# Patient Record
Sex: Female | Born: 1990 | Race: White | Hispanic: No | Marital: Single | Smoking: Never smoker
Health system: Southern US, Community
[De-identification: ages and names within clinical notes are randomized; demographics above are authoritative.]

## PROBLEM LIST (undated history)

## (undated) DIAGNOSIS — T7840XA Allergy, unspecified, initial encounter: Secondary | ICD-10-CM

## (undated) DIAGNOSIS — S43006A Unspecified dislocation of unspecified shoulder joint, initial encounter: Secondary | ICD-10-CM

## (undated) DIAGNOSIS — F909 Attention-deficit hyperactivity disorder, unspecified type: Secondary | ICD-10-CM

## (undated) DIAGNOSIS — F419 Anxiety disorder, unspecified: Secondary | ICD-10-CM

## (undated) HISTORY — DX: Allergy, unspecified, initial encounter: T78.40XA

## (undated) HISTORY — PX: EXTERNAL EAR SURGERY: SHX627

## (undated) HISTORY — DX: Anxiety disorder, unspecified: F41.9

---

## 2010-08-10 HISTORY — PX: SHOULDER SURGERY: SHX246

## 2010-12-17 ENCOUNTER — Emergency Department (HOSPITAL_BASED_OUTPATIENT_CLINIC_OR_DEPARTMENT_OTHER): Payer: Managed Care, Other (non HMO)

## 2010-12-17 ENCOUNTER — Emergency Department (HOSPITAL_BASED_OUTPATIENT_CLINIC_OR_DEPARTMENT_OTHER)
Admission: EM | Admit: 2010-12-17 | Discharge: 2010-12-18 | Disposition: A | Discharge status: Home / Self Care | Payer: Managed Care, Other (non HMO) | Attending: Emergency Medicine | Admitting: Emergency Medicine

## 2010-12-17 DIAGNOSIS — S43006A Unspecified dislocation of unspecified shoulder joint, initial encounter: Secondary | ICD-10-CM | POA: Insufficient documentation

## 2010-12-17 DIAGNOSIS — X500XXA Overexertion from strenuous movement or load, initial encounter: Secondary | ICD-10-CM | POA: Insufficient documentation

## 2010-12-17 DIAGNOSIS — Y92009 Unspecified place in unspecified non-institutional (private) residence as the place of occurrence of the external cause: Secondary | ICD-10-CM | POA: Insufficient documentation

## 2011-05-04 ENCOUNTER — Ambulatory Visit (INDEPENDENT_AMBULATORY_CARE_PROVIDER_SITE_OTHER): Payer: Managed Care, Other (non HMO) | Admitting: Family Medicine

## 2011-05-04 ENCOUNTER — Encounter: Payer: Self-pay | Admitting: Family Medicine

## 2011-05-04 ENCOUNTER — Ambulatory Visit (HOSPITAL_BASED_OUTPATIENT_CLINIC_OR_DEPARTMENT_OTHER)
Admission: RE | Admit: 2011-05-04 | Discharge: 2011-05-04 | Disposition: A | Discharge status: Home / Self Care | Payer: Managed Care, Other (non HMO) | Source: Ambulatory Visit | Attending: Family Medicine | Admitting: Family Medicine

## 2011-05-04 VITALS — BP 136/86 | HR 76 | Temp 98.1°F | Ht 62.0 in | Wt 119.0 lb

## 2011-05-04 DIAGNOSIS — S43006A Unspecified dislocation of unspecified shoulder joint, initial encounter: Secondary | ICD-10-CM

## 2011-05-04 DIAGNOSIS — M24419 Recurrent dislocation, unspecified shoulder: Secondary | ICD-10-CM

## 2011-05-04 DIAGNOSIS — S43005A Unspecified dislocation of left shoulder joint, initial encounter: Secondary | ICD-10-CM

## 2011-05-04 NOTE — Patient Instructions (Signed)
You have recurrent shoulder subluxations and instability. You have also failed conservative treatment for this (physical therapy for at least 6 weeks, relative rest). The next step in evaluation is to go ahead with an MRI arthrogram that looks for a labral tear in this shoulder. This will also dictate what type of surgery you will likely need. I will call you with the results of your MRI and how we are to proceed.

## 2011-05-05 ENCOUNTER — Other Ambulatory Visit: Payer: Self-pay | Admitting: Family Medicine

## 2011-05-05 ENCOUNTER — Encounter: Payer: Self-pay | Admitting: Family Medicine

## 2011-05-05 DIAGNOSIS — S43005A Unspecified dislocation of left shoulder joint, initial encounter: Secondary | ICD-10-CM | POA: Insufficient documentation

## 2011-05-05 NOTE — Assessment & Plan Note (Signed)
2/2 recurrent subluxations and ? dislocations in the past 4 years.  She has already gone through at least 6 weeks of aggressive physical therapy, does home exercises and having persistent issues with subluxations.  Will move forward with MR arthrogram to further assess.  X-rays showed no evidence of bony bankart, otherwise normal.  Will call patient with results.

## 2011-05-05 NOTE — Progress Notes (Signed)
Addended by: Lenda Kelp on: 05/05/2011 11:38 AM   Modules accepted: Orders
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
## 2011-05-05 NOTE — Progress Notes (Signed)
Addended by: Lenda Kelp on: 05/05/2011 11:34 AM   Modules accepted: Orders

## 2011-05-12 ENCOUNTER — Ambulatory Visit
Admission: RE | Admit: 2011-05-12 | Discharge: 2011-05-12 | Disposition: A | Discharge status: Home / Self Care | Payer: Managed Care, Other (non HMO) | Source: Ambulatory Visit | Attending: Family Medicine | Admitting: Family Medicine

## 2011-05-12 DIAGNOSIS — S43005A Unspecified dislocation of left shoulder joint, initial encounter: Secondary | ICD-10-CM

## 2011-05-12 MED ORDER — IOHEXOL 180 MG/ML  SOLN
12.0000 mL | Freq: Once | INTRAMUSCULAR | Status: AC | PRN
  Administered 2011-05-12: 12 mL via INTRA_ARTICULAR

## 2011-05-14 NOTE — Progress Notes (Signed)
Addended by: Lenda Kelp on: 05/14/2011 03:36 PM   Modules accepted: Orders

## 2011-06-23 ENCOUNTER — Other Ambulatory Visit: Payer: Self-pay | Admitting: Orthopedic Surgery

## 2011-07-11 DIAGNOSIS — S43006A Unspecified dislocation of unspecified shoulder joint, initial encounter: Secondary | ICD-10-CM

## 2011-07-11 HISTORY — DX: Unspecified dislocation of unspecified shoulder joint, initial encounter: S43.006A

## 2011-07-30 ENCOUNTER — Encounter (HOSPITAL_BASED_OUTPATIENT_CLINIC_OR_DEPARTMENT_OTHER): Payer: Self-pay | Admitting: *Deleted

## 2011-08-07 ENCOUNTER — Encounter (HOSPITAL_BASED_OUTPATIENT_CLINIC_OR_DEPARTMENT_OTHER): Payer: Self-pay | Admitting: Anesthesiology

## 2011-08-07 ENCOUNTER — Encounter (HOSPITAL_BASED_OUTPATIENT_CLINIC_OR_DEPARTMENT_OTHER)
Admission: RE | Disposition: A | Discharge status: Home / Self Care | Payer: Self-pay | Source: Ambulatory Visit | Attending: Orthopedic Surgery

## 2011-08-07 ENCOUNTER — Ambulatory Visit (HOSPITAL_BASED_OUTPATIENT_CLINIC_OR_DEPARTMENT_OTHER): Payer: Managed Care, Other (non HMO) | Admitting: Anesthesiology

## 2011-08-07 ENCOUNTER — Ambulatory Visit (HOSPITAL_BASED_OUTPATIENT_CLINIC_OR_DEPARTMENT_OTHER)
Admission: RE | Admit: 2011-08-07 | Discharge: 2011-08-07 | Disposition: A | Discharge status: Home / Self Care | Payer: Managed Care, Other (non HMO) | Source: Ambulatory Visit | Attending: Orthopedic Surgery | Admitting: Orthopedic Surgery

## 2011-08-07 ENCOUNTER — Encounter (HOSPITAL_BASED_OUTPATIENT_CLINIC_OR_DEPARTMENT_OTHER): Payer: Self-pay | Admitting: Certified Registered"

## 2011-08-07 ENCOUNTER — Encounter (HOSPITAL_BASED_OUTPATIENT_CLINIC_OR_DEPARTMENT_OTHER): Payer: Self-pay | Admitting: *Deleted

## 2011-08-07 DIAGNOSIS — Z01812 Encounter for preprocedural laboratory examination: Secondary | ICD-10-CM | POA: Insufficient documentation

## 2011-08-07 DIAGNOSIS — M24419 Recurrent dislocation, unspecified shoulder: Secondary | ICD-10-CM | POA: Insufficient documentation

## 2011-08-07 DIAGNOSIS — S43005A Unspecified dislocation of left shoulder joint, initial encounter: Secondary | ICD-10-CM

## 2011-08-07 HISTORY — DX: Unspecified dislocation of unspecified shoulder joint, initial encounter: S43.006A

## 2011-08-07 HISTORY — DX: Attention-deficit hyperactivity disorder, unspecified type: F90.9

## 2011-08-07 HISTORY — PX: SHOULDER ARTHROSCOPY: SHX128

## 2011-08-07 SURGERY — ARTHROSCOPY, SHOULDER
Anesthesia: General | Site: Shoulder | Laterality: Left | Wound class: Clean

## 2011-08-07 MED ORDER — METOCLOPRAMIDE HCL 5 MG/ML IJ SOLN: 10.0000 mg | Freq: Once | INTRAMUSCULAR | Status: DC | PRN

## 2011-08-07 MED ORDER — ONDANSETRON HCL 4 MG/2ML IJ SOLN
4.0000 mg | Freq: Once | INTRAMUSCULAR | Status: AC
  Administered 2011-08-07: 4 mg via INTRAVENOUS

## 2011-08-07 MED ORDER — FENTANYL CITRATE 0.05 MG/ML IJ SOLN: 25.0000 ug | INTRAMUSCULAR | Status: DC | PRN

## 2011-08-07 MED ORDER — OXYCODONE-ACETAMINOPHEN 10-325 MG PO TABS: 1.0000 | ORAL_TABLET | Freq: Four times a day (QID) | ORAL | Status: AC | PRN

## 2011-08-07 MED ORDER — LIDOCAINE HCL 1 % IJ SOLN
INTRAMUSCULAR | Status: DC | PRN
  Administered 2011-08-07: 2 mL via INTRADERMAL

## 2011-08-07 MED ORDER — SODIUM CHLORIDE 0.9 % IR SOLN
Status: DC | PRN
  Administered 2011-08-07: 3000 mL

## 2011-08-07 MED ORDER — DEXAMETHASONE SODIUM PHOSPHATE 4 MG/ML IJ SOLN
INTRAMUSCULAR | Status: DC | PRN
  Administered 2011-08-07: 10 mg via INTRAVENOUS

## 2011-08-07 MED ORDER — SUCCINYLCHOLINE CHLORIDE 20 MG/ML IJ SOLN
INTRAMUSCULAR | Status: DC | PRN
  Administered 2011-08-07: 120 mg via INTRAVENOUS

## 2011-08-07 MED ORDER — ONDANSETRON HCL 4 MG/2ML IJ SOLN
INTRAMUSCULAR | Status: DC | PRN
  Administered 2011-08-07: 4 mg via INTRAVENOUS

## 2011-08-07 MED ORDER — ROPIVACAINE HCL 5 MG/ML IJ SOLN
INTRAMUSCULAR | Status: DC | PRN
  Administered 2011-08-07: 20 mL via EPIDURAL

## 2011-08-07 MED ORDER — PROPOFOL 10 MG/ML IV EMUL
INTRAVENOUS | Status: DC | PRN
  Administered 2011-08-07: 200 mg via INTRAVENOUS

## 2011-08-07 MED ORDER — FENTANYL CITRATE 0.05 MG/ML IJ SOLN
50.0000 ug | INTRAMUSCULAR | Status: DC | PRN
  Administered 2011-08-07: 100 ug via INTRAVENOUS

## 2011-08-07 MED ORDER — MORPHINE SULFATE 2 MG/ML IJ SOLN: 0.0500 mg/kg | INTRAMUSCULAR | Status: DC | PRN

## 2011-08-07 MED ORDER — CEFAZOLIN SODIUM 1-5 GM-% IV SOLN
1.0000 g | INTRAVENOUS | Status: AC
  Administered 2011-08-07: 1 g via INTRAVENOUS

## 2011-08-07 MED ORDER — METHOCARBAMOL 500 MG PO TABS: 500.0000 mg | ORAL_TABLET | Freq: Four times a day (QID) | ORAL | Status: AC

## 2011-08-07 MED ORDER — MIDAZOLAM HCL 2 MG/2ML IJ SOLN
0.5000 mg | INTRAMUSCULAR | Status: DC | PRN
  Administered 2011-08-07: 2 mg via INTRAVENOUS

## 2011-08-07 MED ORDER — CHLORHEXIDINE GLUCONATE 4 % EX LIQD: 60.0000 mL | Freq: Once | CUTANEOUS | Status: DC

## 2011-08-07 MED ORDER — PROMETHAZINE HCL 25 MG PO TABS: 25.0000 mg | ORAL_TABLET | Freq: Four times a day (QID) | ORAL | Status: AC | PRN

## 2011-08-07 MED ORDER — LACTATED RINGERS IV SOLN
INTRAVENOUS | Status: DC
  Administered 2011-08-07 (×2): via INTRAVENOUS

## 2011-08-07 MED ORDER — GLYCOPYRROLATE 0.2 MG/ML IJ SOLN
0.2000 mg | Freq: Once | INTRAMUSCULAR | Status: AC | PRN
  Administered 2011-08-07: 0.1 mg via INTRAVENOUS

## 2011-08-07 SURGICAL SUPPLY — 67 items
2.9MM HIP PUSHLOCK KIT ×2 IMPLANT
ANCHOR PUSHLOCK BIOCOMP 2.9X15 (Orthopedic Implant) ×6 IMPLANT
BENZOIN TINCTURE PRP APPL 2/3 (GAUZE/BANDAGES/DRESSINGS) ×2 IMPLANT
BLADE 4.2CUDA (BLADE) IMPLANT
BLADE CUDA GRT WHITE 3.5 (BLADE) IMPLANT
BLADE CUTTER GATOR 3.5 (BLADE) ×2 IMPLANT
BLADE GREAT WHITE 4.2 (BLADE) IMPLANT
BLADE SURG 15 STRL LF DISP TIS (BLADE) IMPLANT
BLADE SURG 15 STRL SS (BLADE)
BUR OVAL 6.0 (BURR) IMPLANT
CANISTER OMNI JUG 16 LITER (MISCELLANEOUS) ×2 IMPLANT
CANNULA 5.75X71 LONG (CANNULA) ×2 IMPLANT
CANNULA ACUFLEX KIT 5X76 (CANNULA) IMPLANT
CANNULA TWIST IN 8.25X7CM (CANNULA) ×2 IMPLANT
CANNULA TWIST IN 8.25X9CM (CANNULA) IMPLANT
CLOTH BEACON ORANGE TIMEOUT ST (SAFETY) ×2 IMPLANT
CUTTER MENISCUS  4.2MM (BLADE)
CUTTER MENISCUS 4.2MM (BLADE) IMPLANT
DECANTER SPIKE VIAL GLASS SM (MISCELLANEOUS) IMPLANT
DRAPE INCISE IOBAN 66X45 STRL (DRAPES) ×2 IMPLANT
DRAPE SHOULDER BEACH CHAIR (DRAPES) ×2 IMPLANT
DRAPE U 20/CS (DRAPES) ×2 IMPLANT
DRAPE U-SHAPE 47X51 STRL (DRAPES) ×2 IMPLANT
DRSG PAD ABDOMINAL 8X10 ST (GAUZE/BANDAGES/DRESSINGS) ×2 IMPLANT
DURAPREP 26ML APPLICATOR (WOUND CARE) ×2 IMPLANT
ELECT REM PT RETURN 9FT ADLT (ELECTROSURGICAL) ×2
ELECTRODE REM PT RTRN 9FT ADLT (ELECTROSURGICAL) ×1 IMPLANT
FIBERSTICK 2 (SUTURE) ×6 IMPLANT
GLOVE BIO SURGEON STRL SZ 6.5 (GLOVE) ×2 IMPLANT
GLOVE BIO SURGEON STRL SZ8 (GLOVE) ×2 IMPLANT
GLOVE BIOGEL PI IND STRL 7.0 (GLOVE) ×1 IMPLANT
GLOVE BIOGEL PI INDICATOR 7.0 (GLOVE) ×1
GOWN PREVENTION PLUS XLARGE (GOWN DISPOSABLE) ×4 IMPLANT
GOWN PREVENTION PLUS XXLARGE (GOWN DISPOSABLE) ×2 IMPLANT
IMMOBILIZER SHOULDER XLGE (ORTHOPEDIC SUPPLIES) IMPLANT
KIT SHOULDER TRACTION (DRAPES) ×2 IMPLANT
LASSO SUT 90 DEGREE (SUTURE) IMPLANT
LOOP 2 FIBERLINK CLOSED (SUTURE) IMPLANT
PACK ARTHROSCOPY DSU (CUSTOM PROCEDURE TRAY) ×2 IMPLANT
PACK BASIN DAY SURGERY FS (CUSTOM PROCEDURE TRAY) ×2 IMPLANT
SET ARTHROSCOPY TUBING (MISCELLANEOUS) ×1
SET ARTHROSCOPY TUBING LN (MISCELLANEOUS) ×1 IMPLANT
SHEET MEDIUM DRAPE 40X70 STRL (DRAPES) ×2 IMPLANT
SLEEVE SCD COMPRESS KNEE MED (MISCELLANEOUS) ×2 IMPLANT
SLING ARM FOAM STRAP LRG (SOFTGOODS) IMPLANT
SLING ARM FOAM STRAP MED (SOFTGOODS) IMPLANT
SLING ARM FOAM STRAP XLG (SOFTGOODS) IMPLANT
SLING ARM IMMOBILIZER LRG (SOFTGOODS) ×2 IMPLANT
SLING ARM IMMOBILIZER MED (SOFTGOODS) IMPLANT
SPONGE GAUZE 4X4 12PLY (GAUZE/BANDAGES/DRESSINGS) ×2 IMPLANT
STRIP CLOSURE SKIN 1/2X4 (GAUZE/BANDAGES/DRESSINGS) ×2 IMPLANT
SUT FIBERWIRE #2 38 T-5 BLUE (SUTURE)
SUT LASSO 45 DEGREE (SUTURE) IMPLANT
SUT LASSO 45 DEGREE LEFT (SUTURE) ×2 IMPLANT
SUT LASSO 45D RIGHT (SUTURE) IMPLANT
SUT MNCRL AB 4-0 PS2 18 (SUTURE) IMPLANT
SUT PDS AB 1 CT  36 (SUTURE)
SUT PDS AB 1 CT 36 (SUTURE) IMPLANT
SUT TIGER TAPE 7 IN WHITE (SUTURE) IMPLANT
SUT VIC AB 3-0 SH 27 (SUTURE)
SUT VIC AB 3-0 SH 27X BRD (SUTURE) IMPLANT
SUTURE FIBERWR #2 38 T-5 BLUE (SUTURE) IMPLANT
TAPE CLOTH SURG 6X10 WHT LF (GAUZE/BANDAGES/DRESSINGS) ×2 IMPLANT
TOWEL OR 17X24 6PK STRL BLUE (TOWEL DISPOSABLE) ×2 IMPLANT
TUBE CONNECTING 20X1/4 (TUBING) ×2 IMPLANT
WAND STAR VAC 90 (SURGICAL WAND) ×2 IMPLANT
WATER STERILE IRR 1000ML POUR (IV SOLUTION) ×2 IMPLANT

## 2011-08-07 NOTE — Anesthesia Procedure Notes (Addendum)
Anesthesia Regional Block:  Interscalene brachial plexus block  Pre-Anesthetic Checklist: ,, timeout performed, Correct Patient, Correct Site, Correct Laterality, Correct Procedure, Correct Position, site marked, Risks and benefits discussed,  Surgical consent,  Pre-op evaluation,  At surgeon's request and post-op pain management  Laterality: Left  Prep: chloraprep       Needles:   Needle Type: Other   (Arrow Echogenic)   Needle Length: 9cm  Needle Gauge: 21    Additional Needles:  Procedures: ultrasound guided Interscalene brachial plexus block Narrative:  Start time: 08/07/2011 7:00 AM End time: 08/07/2011 7:08 AM Injection made incrementally with aspirations every 5 mL.  Performed by: Personally  Anesthesiologist: Aldona Lento, MD  Additional Notes: Ultrasound guidance used to: id relevant anatomy, confirm needle position, local anesthetic spread, avoidance of vascular puncture. Picture saved. No complications. Block performed personally by Janetta Hora. Gelene Mink, MD    Interscalene brachial plexus block Procedure Name: Intubation Date/Time: 08/07/2011 7:44 AM Performed by: Radford Pax Pre-anesthesia Checklist: Patient identified, Emergency Drugs available, Suction available, Patient being monitored and Timeout performed Patient Re-evaluated:Patient Re-evaluated prior to inductionOxygen Delivery Method: Circle System Utilized Preoxygenation: Pre-oxygenation with 100% oxygen Intubation Type: IV induction Ventilation: Mask ventilation without difficulty Laryngoscope Size: 3 and Miller Grade View: Grade I Tube type: Oral (cords open and clear) Number of attempts: 1 (easy intubation) Airway Equipment and Method: stylet and oral airway Placement Confirmation: ETT inserted through vocal cords under direct vision,  positive ETCO2 and breath sounds checked- equal and bilateral Secured at: 21 cm Tube secured with: Tape (pink tape used) Dental Injury: Teeth and  Oropharynx as per pre-operative assessment

## 2011-08-07 NOTE — Anesthesia Postprocedure Evaluation (Signed)
Anesthesia Post Note  Patient: Karen Hensley  Procedure(s) Performed:  ARTHROSCOPY SHOULDER - arthroscopy left shoulder with extensive debridement, capsulorrhaphy, Bankhardt repair  Anesthesia type: General  Patient location: PACU  Post pain: Pain level controlled  Post assessment: Patient's Cardiovascular Status Stable  Last Vitals:  Filed Vitals:   08/07/11 1034  BP: 111/75  Pulse: 84  Temp: 36.4 C  Resp: 22    Post vital signs: Reviewed and stable  Level of consciousness: alert  Complications: No apparent anesthesia complications

## 2011-08-07 NOTE — Anesthesia Preprocedure Evaluation (Signed)
Anesthesia Evaluation  Patient identified by MRN, date of birth, ID band Patient awake    Reviewed: Allergy & Precautions, H&P , NPO status , Patient's Chart, lab work & pertinent test results, reviewed documented beta blocker date and time   Airway Mallampati: II TM Distance: >3 FB Neck ROM: full    Dental   Pulmonary neg pulmonary ROS,          Cardiovascular neg cardio ROS     Neuro/Psych PSYCHIATRIC DISORDERS Negative Neurological ROS     GI/Hepatic negative GI ROS, Neg liver ROS,   Endo/Other  Negative Endocrine ROS  Renal/GU negative Renal ROS  Genitourinary negative   Musculoskeletal   Abdominal   Peds  Hematology negative hematology ROS (+)   Anesthesia Other Findings See surgeon's H&P   Reproductive/Obstetrics negative OB ROS                           Anesthesia Physical Anesthesia Plan  ASA: II  Anesthesia Plan: General   Post-op Pain Management: MAC Combined w/ Regional for Post-op pain   Induction: Intravenous  Airway Management Planned: Oral ETT  Additional Equipment:   Intra-op Plan:   Post-operative Plan: Extubation in OR  Informed Consent: I have reviewed the patients History and Physical, chart, labs and discussed the procedure including the risks, benefits and alternatives for the proposed anesthesia with the patient or authorized representative who has indicated his/her understanding and acceptance.     Plan Discussed with: CRNA and Surgeon  Anesthesia Plan Comments:         Anesthesia Quick Evaluation

## 2011-08-07 NOTE — Op Note (Signed)
08/07/2011  9:31 AM  PATIENT:  Karen Hensley    PRE-OPERATIVE DIAGNOSIS:  left shoulder dislocation/subluxation, recurrent instability  POST-OPERATIVE DIAGNOSIS:  Same  PROCEDURE:  ARTHROSCOPY SHOULDER Bankart anterior reconstruction  SURGEON:  Ira Busbin P  PHYSICIAN ASSISTANT: Janace Litten, OPA-C, present and scrubbed throughout the case, critical for completion in a timely fashion, and for retraction, instrumentation, and closure.  ANESTHESIA:   General  PREOPERATIVE INDICATIONS:  Karen Hensley is a  20 y.o. female with a diagnosis of left shoulder dislocation/subluxation, recurrent instability who failed conservative measures and elected for surgical management.  She has had "over 1000" instability events per her report.  The risks benefits and alternatives were discussed with the patient preoperatively including but not limited to the risks of infection, bleeding, nerve injury, cardiopulmonary complications, the need for revision surgery, among others, and the patient was willing to proceed. We also discussed the risks of instability, recurrent, the need for Latarjet reconstruction, posttraumatic arthrosis, stiffness, loss of function, among others and she was willing to proceed.  OPERATIVE IMPLANTS: Arthrex 2.9 mm bio composite anchors, push lock x3  OPERATIVE FINDINGS: Massive anterior glenoid bone loss, at least 40%. There was also a bony Bankart. There was a moderate sized Hill-Sachs lesion. The superior labrum was well anchored to the glenoid and the biceps tendon was intact. The posterior labrum was also intact. Examination under anesthesia demonstrated easy anterior dislocation, while the posterior was stable.  OPERATIVE PROCEDURE: The patient was brought to the operating room and placed in supine position. General anesthesia was administered. IV antibiotics were given. She was turned in a semilateral decubitus position all bony prominences were padded. The left upper  extremity was prepped and draped in usual sterile fashion. Exam under anesthesia was performed prior to prepping. Time out was performed. After diagnostic arthroscopy, I placed 2 anterior portals and viewing from the anterior superior portal. I elevated the anterior labrum using a spatula elevator, and also used the arthroscopic shaver to prepare the bone anterior on the glenoid neck. I elevated the bony fragments from the bony Bankart all the way down, and once I had excellent mobility, I then placed a total of 3 simple sutures. These were in a circumferential type configuration, ensnaring the bony Bankart piece. The first stitch I placed in 2 passes in order to get deep enough to fully encapsulate the bony fragment.  I passed 2 sutures at first, both around the bony Bankart piece, and then anchored the inferior one, advancing it up onto the face. I was nearly on the bare spot, which was the anterior glenoid rim, in order to achieve bony purchase with the anchor.  I then secured the second suture, and this provided excellent advancement of the soft tissue and restoration of tension.  I then passed a third suture just at the level of the middle glenohumeral ligament, just anterior to the subscapularis, and then anchored to this using a third push lock at the top quadrant of the glenoid.  Final pictures were taken, all loose debris was removed, the sutures were cut and removed, and excellent soft tissue reconstruction was achieved. The shoulder was irrigated copiously and then the instruments removed and the portals repaired with Monocryl followed by Steri-Strips and sterile gauze. There no complications and she tolerated the procedure well.

## 2011-08-07 NOTE — Progress Notes (Signed)
Assisted Dr. Gelene Mink with left interscaline block. Side rails up, monitors on throughout procedure. See vital signs in flow sheet. Tolerated Procedure well.

## 2011-08-07 NOTE — H&P (Signed)
  PREOPERATIVE H&P  Chief Complaint: left shoulder dislocation/subluxation, recurrent instability  HPI: Karen Hensley is a 20 y.o. female who presents for preoperative history and physical with a diagnosis of left shoulder dislocation/subluxation, recurrent instability. Symptoms are rated as moderate to severe, and have been worsening.  This is significantly impairing activities of daily living.  She has elected for surgical management.   Past Medical History  Diagnosis Date  . Anxiety     anxiety attacks  . 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 ADHD (attention deficit hyperactivity disorder)    Past Surgical History  Procedure Date  . External ear surgery 4-5 yrs. ago    cauliflower ear   History   Social History  . Marital Status: Single    Spouse Name: N/A    Number of Children: N/A  . Years of Education: N/A   Social History Main Topics  . Smoking status: Never Smoker   . Smokeless tobacco: Never Used  . Alcohol Use: Yes     occasionally  . Drug Use: No  . Sexually Active: None   Other Topics Concern  . None   Social History Narrative  . None   Family History  Problem Relation Age of Onset  . Heart attack Paternal Grandfather   . Diabetes Neg Hx   . Hyperlipidemia Neg Hx   . Hypertension Neg Hx   . Sudden death Neg Hx    Allergies  Allergen Reactions  . Sulfa Antibiotics Swelling    Swelling of face   Prior to Admission medications   Medication Sig Start Date End Date Taking? Authorizing Provider  amphetamine-dextroamphetamine (ADDERALL XR) 10 MG 24 hr capsule Take 10 mg by mouth every morning.      Historical Provider, MD     Positive ROS: All other systems have been reviewed and were otherwise negative with the exception of those mentioned in the HPI and as above.  Physical Exam: General: Alert, no acute distress Cardiovascular: No pedal edema Respiratory: No cyanosis, no use of accessory musculature GI:  No organomegaly, abdomen is soft and non-tender Skin: No lesions in the area of chief complaint Neurologic: Sensation intact distally Psychiatric: Patient is competent for consent with normal mood and affect Lymphatic: No axillary or cervical lymphadenopathy  MUSCULOSKELETAL: Left shoulder has positive apprehension. Sensation intact at the hand.  Assessment: left shoulder dislocation/subluxation, recurrent instability  Plan: Plan for Procedure(s): ARTHROSCOPY SHOULDER, anterior capsulorrhaphy and labral repair with bony Bankart repair  The risks benefits and alternatives were discussed with the patient including but not limited to the risks of nonoperative treatment, versus surgical intervention including infection, bleeding, nerve injury,  blood clots, cardiopulmonary complications, morbidity, mortality, among others, and they were willing to proceed. We'll discuss the risks of recurrent instability, posttraumatic chondrolysis and arthritis, need for revision surgery, the possibility of Latarjet reconstruction, among others.  Corran Lalone P 08/07/2011 7:32 AM

## 2011-08-07 NOTE — Transfer of Care (Signed)
Immediate Anesthesia Transfer of Care Note  Patient: Karen Hensley  Procedure(s) Performed:  ARTHROSCOPY SHOULDER - arthroscopy left shoulder with extensive debridement, capsulorrhaphy, Bankhardt repair  Patient Location: PACU  Anesthesia Type: GA combined with regional for post-op pain  Level of Consciousness: awake, alert , oriented and patient cooperative  Airway & Oxygen Therapy: Patient Spontanous Breathing and Patient connected to face mask oxygen  Post-op Assessment: Report given to PACU RN and Post -op Vital signs reviewed and stable  Post vital signs: Reviewed and stable  Complications: No apparent anesthesia complications

## 2011-08-12 ENCOUNTER — Ambulatory Visit (INDEPENDENT_AMBULATORY_CARE_PROVIDER_SITE_OTHER): Payer: Managed Care, Other (non HMO) | Admitting: Internal Medicine

## 2011-08-12 DIAGNOSIS — F988 Other specified behavioral and emotional disorders with onset usually occurring in childhood and adolescence: Secondary | ICD-10-CM

## 2011-08-17 ENCOUNTER — Encounter (HOSPITAL_BASED_OUTPATIENT_CLINIC_OR_DEPARTMENT_OTHER): Payer: Self-pay | Admitting: Orthopedic Surgery

## 2011-10-15 ENCOUNTER — Emergency Department (HOSPITAL_COMMUNITY)
Admission: EM | Admit: 2011-10-15 | Discharge: 2011-10-15 | Disposition: A | Discharge status: Home / Self Care | Payer: Managed Care, Other (non HMO) | Attending: Emergency Medicine | Admitting: Emergency Medicine

## 2011-10-15 ENCOUNTER — Encounter (HOSPITAL_COMMUNITY): Payer: Self-pay | Admitting: Emergency Medicine

## 2011-10-15 DIAGNOSIS — W540XXA Bitten by dog, initial encounter: Secondary | ICD-10-CM | POA: Insufficient documentation

## 2011-10-15 DIAGNOSIS — S01501A Unspecified open wound of lip, initial encounter: Secondary | ICD-10-CM | POA: Insufficient documentation

## 2011-10-15 DIAGNOSIS — S01511A Laceration without foreign body of lip, initial encounter: Secondary | ICD-10-CM

## 2011-10-15 MED ORDER — IBUPROFEN 800 MG PO TABS: 800.0000 mg | ORAL_TABLET | Freq: Three times a day (TID) | ORAL | Status: AC | PRN

## 2011-10-15 MED ORDER — IBUPROFEN 800 MG PO TABS
800.0000 mg | ORAL_TABLET | Freq: Once | ORAL | Status: AC
  Administered 2011-10-15: 800 mg via ORAL
  Filled 2011-10-15: qty 1

## 2011-10-15 MED ORDER — TETANUS-DIPHTH-ACELL PERTUSSIS 5-2.5-18.5 LF-MCG/0.5 IM SUSP
0.5000 mL | Freq: Once | INTRAMUSCULAR | Status: AC
  Administered 2011-10-15: 0.5 mL via INTRAMUSCULAR
  Filled 2011-10-15: qty 0.5

## 2011-10-15 MED ORDER — LIDOCAINE HCL 1 % IJ SOLN
INTRAMUSCULAR | Status: AC
  Administered 2011-10-15: 20 mL
  Filled 2011-10-15: qty 20

## 2011-10-15 MED ORDER — AMOXICILLIN-POT CLAVULANATE 875-125 MG PO TABS: 1.0000 | ORAL_TABLET | Freq: Two times a day (BID) | ORAL | Status: AC

## 2011-10-15 NOTE — ED Notes (Signed)
Pt alert,nad, c/o laceration upper lip. Onset this am, pt states bitten by personal dog, bleeding controlled, dsd, three laceration noted

## 2011-10-15 NOTE — Discharge Instructions (Signed)
Return here for any worsening in your condition.  Return here for any signs of infection.  Keep the wounds clean and dry.  He may need followup with a plastic surgeon at some point for revision of these wounds if they start to have significant scarring.  You can use scar reduction techniques like vitamin E oil on the wounds with gentle massage to break down scar tissue formation.  You will also need to keep the areas covered with sunscreen are lip balm with sun protection that during the times were illness gone for extended periods of time.

## 2011-10-15 NOTE — ED Notes (Signed)
PA, Lawyer at bedside. 

## 2011-10-15 NOTE — ED Notes (Signed)
Ice provided to pt to hold on wound. Pt tearful. Friends at bedside.

## 2011-10-15 NOTE — ED Provider Notes (Signed)
Medical screening examination/treatment/procedure(s) were conducted as a shared visit with non-physician practitioner(s) and myself.  I personally evaluated the patient during the encounter  Elah Avellino L Drayke Grabel, MD 10/15/11 2315 

## 2011-10-15 NOTE — ED Provider Notes (Signed)
History     CSN: [id number]  Arrival date & time 10/15/11  5284   First MD Initiated Contact with Patient 10/15/11 [phone]      Chief Complaint  Patient presents with  . Lip Laceration  . Animal Bite    (Consider location/radiation/quality/duration/timing/severity/associated sxs/prior treatment) HPI Patient presents to the emergency department following a dog bite.  She states that she was bitten in the lip by her [place].  She has 3 lacerations noted to her upper lip.  Patient states her tetanus shot was around 5 years ago.  She denies any other injuries. Past Medical History  Diagnosis Date  . Anxiety     anxiety attacks  . 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 ADHD (attention deficit hyperactivity disorder)     Past Surgical History  Procedure Date  . External ear surgery 4-5 yrs. ago    cauliflower ear  . Shoulder arthroscopy 08/07/2011    Procedure: ARTHROSCOPY SHOULDER;  Surgeon: Eulas Post;  Location: [facility];  Service: Orthopedics;  Laterality: Left;  arthroscopy left shoulder with extensive debridement, capsulorrhaphy, Bankhardt repair    Family History  Problem Relation Age of Onset  . Heart attack Paternal Grandfather   . Diabetes Neg Hx   . Hyperlipidemia Neg Hx   . Hypertension Neg Hx   . Sudden death Neg Hx     History  Substance Use Topics  . Smoking status: Never Smoker   . Smokeless tobacco: Never Used  . Alcohol Use: Yes     occasionally    OB History    Grav Para Term Preterm Abortions TAB SAB Ect Mult Living                  Review of Systems All pertinent positives and negatives reviewed in the history of present illness  Allergies  Sulfa antibiotics  Home Medications   Current Outpatient Rx  Name Route Sig Dispense Refill  . AMPHETAMINE-DEXTROAMPHET ER 10 MG PO CP24 Oral Take 10 mg by mouth every morning.        BP 140/91  Pulse 84  Temp 98 F (36.7 C)  Resp 16   SpO2 100%  Physical Exam  Constitutional: She is oriented to person, place, and time. She appears well-developed and well-nourished.  HENT:  Head: Normocephalic.    Mouth/Throat:    Eyes: Pupils are equal, round, and reactive to light.  Cardiovascular: Normal rate, regular rhythm and normal heart sounds.  Exam reveals no gallop and no friction rub.   No murmur heard. Pulmonary/Chest: Effort normal and breath sounds normal.  Neurological: She is alert and oriented to person, place, and time.    ED Course  Procedures (including critical care time)   LACERATION REPAIR Performed by: Carlyle Dolly Authorized by: Carlyle Dolly Consent: Verbal consent obtained. Risks and benefits: risks, benefits and alternatives were discussed Consent given by: patient Patient identity confirmed: provided demographic data Prepped and Draped in normal sterile fashion Wound explored  Laceration Location R upper Lip   Laceration Length: 2cm  No Foreign Bodies seen or palpated  Anesthesia: local infiltration  Local anesthetic: lidocaine 2% wo epinephrine  Anesthetic total: 2 ml  Irrigation method: syringe Amount of cleaning: standard  Skin closure: Prolene 7-0  Number of sutures:4 Technique: *Simple Interrupted   Patient tolerance: Patient tolerated the procedure well with no immediate complications.  The vermilion border was aligned in the appropriate anatomical position.  LACERATION REPAIR Performed  by: Deryn Massengale W Authorized by: Carlyle Dolly Consent: Verbal consent obtained. Risks and benefits: risks, benefits and alternatives were discussed Consent given by: patient Patient identity confirmed: provided demographic data Prepped and Draped in normal sterile fashion Wound explored  Laceration Location:L upper lip   Laceration Length: 2.1cm  No Foreign Bodies seen or palpated  Anesthesia: local infiltration  Local anesthetic: lidocaine 2%  wo epinephrine  Anesthetic total:2 ml  Irrigation method: syringe Amount of cleaning: standard  Skin closure: Prolene 7-0  Number of sutures: 4  Technique: Simple interrupted.  Patient tolerance: Patient tolerated the procedure well with no immediate complications.   The vermilion border was aligned in the appropriate anatomical position.  LACERATION REPAIR Performed by: Carlyle Dolly Authorized by: Carlyle Dolly Consent: Verbal consent obtained. Risks and benefits: risks, benefits and alternatives were discussed Consent given by: patient Patient identity confirmed: provided demographic data Prepped and Draped in normal sterile fashion Wound explored  Laceration Location: Mid upper lip Laceration Length: 0.4 cm  No Foreign Bodies seen or palpated  Anesthesia: local infiltration  Local anesthetic:I numbed the other wound that cross covered this small wound Anesthetic total: 0l  Irrigation method: syringe Amount of cleaning: standard  Skin closure: Prolene 7-0 Number of sutures: 1 Technique:Simple Interrupted   Patient tolerance: Patient tolerated the procedure well with no immediate complications.  Did not cross the vermilion border.   Explained to the patient that she may need further evaluation at a later date with plastic surgery.  Explained she may need revision to these wounds at some point.  I advised her on scar reduction techniques.  Advised.  To ensure that the area was covered with SPF coverage during the times when she is out in the sun.  I advised her that we left one of the wounds open as it seemed to be 1 punctured into the frenulum and we loosely approximated all the other wounds as well.  Address her will be to place her on Augmentin for 10 days coverage to help prevent infection.  She is advised to return here for any worsening in her condition or signs of infection.  I did not appreciate any foreign bodies in these wounds or signs of  broken dentition from the dog.   The patient has been seen by the attending Physician.    MDM          Carlyle Dolly, PA-C 10/15/11 0830

## 2011-12-07 ENCOUNTER — Telehealth: Payer: Self-pay

## 2011-12-07 NOTE — Telephone Encounter (Signed)
Pt requesting adderall refill    Best phone [phone]

## 2011-12-08 MED ORDER — AMPHETAMINE-DEXTROAMPHETAMINE 20 MG PO TABS: 20.0000 mg | ORAL_TABLET | Freq: Two times a day (BID) | ORAL | Status: DC

## 2011-12-08 NOTE — Telephone Encounter (Signed)
Spoke with patient and let her know that rx was ready at front desk.

## 2011-12-08 NOTE — Telephone Encounter (Signed)
Please pull chart.

## 2011-12-08 NOTE — Telephone Encounter (Signed)
Done ready for pick-up 

## 2011-12-08 NOTE — Telephone Encounter (Signed)
Pulled to pa pool

## 2012-01-12 ENCOUNTER — Telehealth: Payer: Self-pay

## 2012-01-12 MED ORDER — AMPHETAMINE-DEXTROAMPHETAMINE 20 MG PO TABS: 20.0000 mg | ORAL_TABLET | Freq: Two times a day (BID) | ORAL | Status: DC

## 2012-01-12 NOTE — Telephone Encounter (Signed)
LMOM that Rx is ready and that she needs OV for add'l RFS

## 2012-01-12 NOTE — Telephone Encounter (Signed)
PT IN NEED OF HER ADDERALL. PLEASE CALL [phone] WHEN READY FOR P/U

## 2012-01-12 NOTE — Telephone Encounter (Signed)
Rx refilled x 1 month. Needs office visit for additional refills

## 2012-01-12 NOTE — Telephone Encounter (Signed)
CHART IS AT NURSES STATION FOR REVIEW 

## 2012-02-09 ENCOUNTER — Ambulatory Visit (INDEPENDENT_AMBULATORY_CARE_PROVIDER_SITE_OTHER): Payer: Managed Care, Other (non HMO) | Admitting: Family Medicine

## 2012-02-09 VITALS — BP 118/56 | HR 77 | Temp 98.2°F | Resp 12 | Ht 62.0 in | Wt 117.2 lb

## 2012-02-09 DIAGNOSIS — N390 Urinary tract infection, site not specified: Secondary | ICD-10-CM

## 2012-02-09 DIAGNOSIS — R300 Dysuria: Secondary | ICD-10-CM

## 2012-02-09 DIAGNOSIS — H109 Unspecified conjunctivitis: Secondary | ICD-10-CM

## 2012-02-09 LAB — POCT UA - MICROSCOPIC ONLY: Casts, Ur, LPF, POC: NEGATIVE

## 2012-02-09 LAB — POCT URINALYSIS DIPSTICK
Bilirubin, UA: NEGATIVE
Glucose, UA: NEGATIVE
Spec Grav, UA: 1.02

## 2012-02-09 MED ORDER — NITROFURANTOIN MONOHYD MACRO 100 MG PO CAPS: 100.0000 mg | ORAL_CAPSULE | Freq: Two times a day (BID) | ORAL | Status: AC

## 2012-02-09 MED ORDER — CIPROFLOXACIN HCL 0.3 % OP SOLN: OPHTHALMIC | Status: DC

## 2012-02-09 NOTE — Progress Notes (Signed)
  Subjective:    Patient ID: Karen Hensley, female    DOB: 24-Sep-1990, 21 y.o.   MRN: [id number]  HPI Karen Hensley is a 21 y.o. female Pink eye - redness started 3 days ago. Wears contacts - current ones x 1 month - monthly, but removes at night. [place] - had to wear contacts past few days.  Crusting noticed yesterday - worse this am.  Blurry since last night.  No hx of corneal ulcer or abrasion. No known FB or injury.   Dysuria - today.  No blood.   LMP - just stopped yesterday.  No known spotting today. Not currently sexual active.  Last UTI - 3 years ago.    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 Nonsmoker.  No known sick contacts.    Review of Systems  Constitutional: Negative for fever and chills.  Eyes: Positive for discharge, redness and visual disturbance. Negative for photophobia.  Genitourinary: Positive for dysuria. Negative for urgency, frequency, decreased urine volume, vaginal discharge and difficulty urinating.       Objective:   Physical Exam  Constitutional: She is oriented to person, place, and time. She appears well-developed and well-nourished.  HENT:  Head: Normocephalic and atraumatic.  Eyes: EOM and lids are normal. Pupils are equal, round, and reactive to light. No foreign bodies found. Right conjunctiva is injected. Left conjunctiva is not injected.    Cardiovascular: Normal rate, regular rhythm, normal heart sounds and intact distal pulses.   Pulmonary/Chest: Effort normal and breath sounds normal.  Abdominal: Soft. She exhibits no distension. There is no tenderness. There is no rebound, no guarding and no CVA tenderness.  Neurological: She is alert and oriented to person, place, and time.  Skin: Skin is warm and dry.  Psychiatric: She has a normal mood and affect. Her behavior is normal.   Results for orders placed in visit on 02/09/12  POCT UA - MICROSCOPIC ONLY      Component Value Range   WBC, Ur, HPF, POC 4-10     RBC, urine, microscopic 3-5     Bacteria, U Microscopic trace     Mucus, UA trace     Epithelial cells, urine per micros 1-4     Crystals, Ur, HPF, POC negative     Casts, Ur, LPF, POC negative     Yeast, UA negative    POCT URINALYSIS DIPSTICK      Component Value Range   Color, UA yellow     Clarity, UA slightly cloudy     Glucose, UA negative     Bilirubin, UA negative     Ketones, UA negative     Spec Grav, UA 1.020     Blood, UA small     pH, UA 7.0     Protein, UA negative     Urobilinogen, UA 0.2     Nitrite, UA negative     Leukocytes, UA small (1+)         Assessment & Plan:  Karen Hensley is a 21 y.o. female 1. Dysuria  POCT UA - Microscopic Only, POCT urinalysis dipstick  2. UTI (lower urinary tract infection)    3. Conjunctivitis of right eye     UTI - early - macrobid 100mg  BID.  See h/o.  rtc precautions.  R conjunctivitis -no apparent abrasion or ulcer. ciloxan x 1 week.  Avoid contacts for 1 week, and new set when does restart.  rtc if any worsening or not improved in 48-72 hours.

## 2012-02-09 NOTE — Patient Instructions (Signed)
No contact lenses for 1 week.  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 Return to the clinic or go to the nearest emergency room if any of your symptoms worsen or new symptoms occur.  Urinary Tract Infection Infections of the urinary tract can start in several places. A bladder infection (cystitis), a kidney infection (pyelonephritis), and a prostate infection (prostatitis) are different types of urinary tract infections (UTIs). They usually get better if treated with medicines (antibiotics) that kill germs. Take all the medicine until it is gone. You or your child may feel better in a few days, but TAKE ALL MEDICINE or the infection may not respond and may become more difficult to treat. HOME CARE INSTRUCTIONS   Drink enough water and fluids to keep the urine clear or pale yellow. Cranberry juice is especially recommended, in addition to large amounts of water.   Avoid caffeine, tea, and carbonated beverages. They tend to irritate the bladder.   Alcohol may irritate the prostate.   Only take over-the-counter or prescription medicines for pain, discomfort, or fever as directed by your caregiver.  To prevent further infections:  Empty the bladder often. Avoid holding urine for long periods of time.   After a bowel movement, women should cleanse from front to back. Use each tissue only once.   Empty the bladder before and after sexual intercourse.  FINDING OUT THE RESULTS OF YOUR TEST Not all test results are available during your visit. If your or your child's test results are not back during the visit, make an appointment with your caregiver to find out the results. Do not assume everything is normal if you have not heard from your caregiver or the medical facility. It is important for you to follow up on all test results. SEEK MEDICAL CARE IF:   There is back pain.   Your baby is older than 3 months with a rectal temperature of 100.5 F (38.1 C) or higher for more than 1 day.   Your or your  child's problems (symptoms) are no better in 3 days. Return sooner if you or your child is getting worse.  SEEK IMMEDIATE MEDICAL CARE IF:   There is severe back pain or lower abdominal pain.   You or your child develops chills.   You have a fever.   Your baby is older than 3 months with a rectal temperature of 102 F (38.9 C) or higher.   Your baby is 59 months old or younger with a rectal temperature of 100.4 F (38 C) or higher.   There is nausea or vomiting.   There is continued burning or discomfort with urination.  MAKE SURE YOU:   Understand these instructions.   Will watch your condition.   Will get help right away if you are not doing well or get worse.  Document Released: 05/06/2005 Document Revised: 07/16/2011 Document Reviewed: 12/09/2006 [facility] Patient Information 2012 [place], [place].  Conjunctivitis Conjunctivitis is commonly called "pink eye." Conjunctivitis can be caused by bacterial or viral infection, allergies, or injuries. There is usually redness of the lining of the eye, itching, discomfort, and sometimes discharge. There may be deposits of matter along the eyelids. A viral infection usually causes a watery discharge, while a bacterial infection causes a yellowish, thick discharge. Pink eye is very contagious and spreads by direct contact. You may be given antibiotic eyedrops as part of your treatment. Before using your eye medicine, remove all drainage from the eye by washing gently with warm water and cotton  balls. Continue to use the medication until you have awakened 2 mornings in a row without discharge from the eye. Do not rub your eye. This increases the irritation and helps spread infection. Use separate towels from other household members. Wash your hands with soap and water before and after touching your eyes. Use cold compresses to reduce pain and sunglasses to relieve irritation from light. Do not wear contact lenses or wear eye makeup until the  infection is gone. SEEK MEDICAL CARE IF:   Your symptoms are not better after 3 days of treatment.   You have increased pain or trouble seeing.   The outer eyelids become very red or swollen.  Document Released: 09/03/2004 Document Revised: 07/16/2011 Document Reviewed: 07/27/2005 [facility] Patient Information 2012 [place], [place].

## 2012-03-22 ENCOUNTER — Telehealth: Payer: Self-pay

## 2012-03-22 NOTE — Telephone Encounter (Signed)
PT REQUESTING ADDERALL 20 REFILL # [id number] TABS   BEST PHONE [phone]

## 2012-03-22 NOTE — Telephone Encounter (Signed)
I need her follow up plan she is due for office visit for these meds. I called her and left message for her to call back about this.

## 2012-03-23 NOTE — Telephone Encounter (Signed)
Left another message for her to call me back, she is due for follow up I need to know what her plan is for this.

## 2012-03-25 MED ORDER — AMPHETAMINE-DEXTROAMPHETAMINE 20 MG PO TABS: 20.0000 mg | ORAL_TABLET | Freq: Two times a day (BID) | ORAL | Status: DC

## 2012-03-25 NOTE — Telephone Encounter (Signed)
Left another message for her. I need to know her follow up plan

## 2012-03-25 NOTE — Telephone Encounter (Signed)
She did not have plans to follow up, was advised she needs to follow up, she is requesting a 1 month rx for Adderall and she can follow up before this runs out. Please advise.

## 2012-03-25 NOTE — Telephone Encounter (Signed)
Done and printed

## 2012-03-26 NOTE — Telephone Encounter (Signed)
LMOM THAT RX IS READY FOR PICKUP 

## 2012-04-12 ENCOUNTER — Ambulatory Visit (INDEPENDENT_AMBULATORY_CARE_PROVIDER_SITE_OTHER): Payer: Managed Care, Other (non HMO) | Admitting: Family Medicine

## 2012-04-12 VITALS — BP 127/70 | HR 86 | Temp 98.6°F | Resp 16 | Ht 63.0 in | Wt 114.0 lb

## 2012-04-12 DIAGNOSIS — F909 Attention-deficit hyperactivity disorder, unspecified type: Secondary | ICD-10-CM

## 2012-04-12 MED ORDER — AMPHETAMINE-DEXTROAMPHETAMINE 20 MG PO TABS: 20.0000 mg | ORAL_TABLET | Freq: Two times a day (BID) | ORAL | Status: DC

## 2012-04-12 NOTE — Progress Notes (Signed)
Karen Hensley is a 21 y.o. female who presents to [facility] today for refill of Adderall. Patient has ADHD for sometime now she takes a stable dose of Adderall 20 mg twice daily. Doing well no text. She does note some mild anxiety however she manages this with behavior control.  She feels well otherwise   PMH: Reviewed ADHD History  Substance Use Topics  . Smoking status: Never Smoker   . Smokeless tobacco: Never Used  . Alcohol Use: Yes     occasionally   ROS as above  Medications reviewed. Current Outpatient Prescriptions  Medication Sig Dispense Refill  . amphetamine-dextroamphetamine (ADDERALL) 20 MG tablet Take 1 tablet (20 mg total) by mouth 2 (two) times daily. Fill 30 days from now  60 tablet  0  . amphetamine-dextroamphetamine (ADDERALL) 20 MG tablet Take 1 tablet (20 mg total) by mouth 2 (two) times daily. Fill 60 days from now  60 tablet  0  . amphetamine-dextroamphetamine (ADDERALL) 20 MG tablet Take 1 tablet (20 mg total) by mouth 2 (two) times daily.  60 tablet  0  . DISCONTD: amphetamine-dextroamphetamine (ADDERALL) 20 MG tablet Take 1 tablet (20 mg total) by mouth 2 (two) times daily. Needs office visit for additional refills.  60 tablet  0    Exam:  BP 127/70  Pulse 86  Temp 98.6 F (37 C)  Resp 16  Ht 5\' 3"  (1.6 m)  Wt 114 lb (51.71 kg)  BMI 20.19 kg/m2  LMP 04/05/2012 Gen: Well NAD PSYCH: Alert and oriented normally conversant normal thought process linear and goal-directed  No results found for this or any previous visit (from the past 72 hour(s)).  Assessment and Plan: 21 y.o. female with ADHD. Stable plan for 3 month refill of Adderall 20 mg twice daily followup in 3 months.

## 2012-04-12 NOTE — Patient Instructions (Addendum)
Thank you for coming in today. Please come back in 3 months for a refill.  Let us know how school does.

## 2012-06-07 IMAGING — RF DG FLUORO GUIDE NDL PLC/BX
1 series · 1 of 1 positions shown · non-contrast
Comparison: none

CLINICAL DATA: Shoulder instability.  Recurrent dislocations.
Evaluate for labral tear.

[Series 1: (hospital) · 1 of 1 slices shown]
[im 1/1]
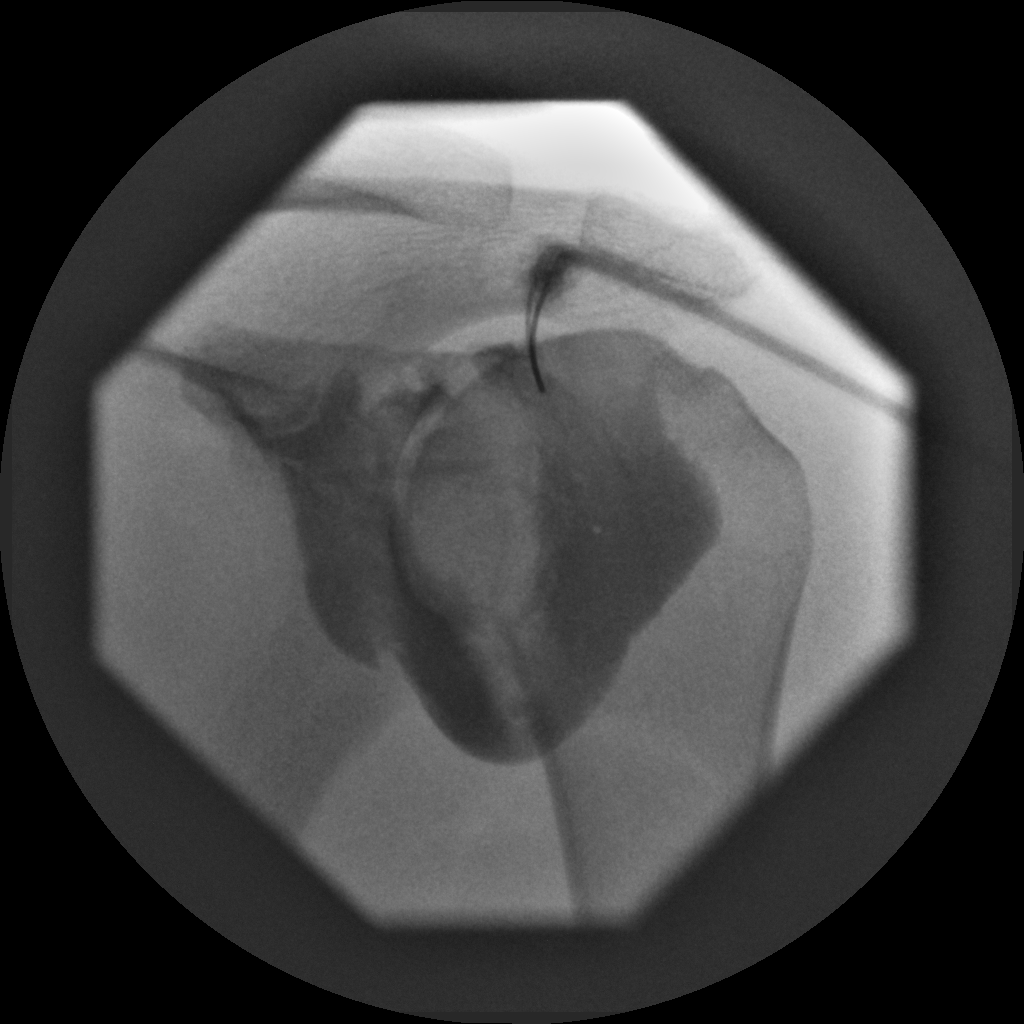

[1 of 1 positions shown; findings below may reference images not displayed]

SHOULDER INJECTION FOR MRI

Procedure:  After a thorough discussion of risks and benefits of
the procedure, written and oral informed consent was obtained.  The
consent discussion included the risk of bleeding, infection and
injury to nerves and adjacent blood vessels.  Extra-articular
injection was also a possible risk discussed.  Verbal  consent was
obtained by Dr. Lesya.

Preliminary localization was performed over the left shoulder.  The
area was marked over superior medial anterior humeral head.

After prep and drape in the usual sterile fashion, the skin and
deeper subcutaneous tissues were anesthetized with 1% Lidocaine
without Epinephrine.  Under fluoroscopic guidance, a 22 gauge
inch spinal needle was advanced into the joint over the superior
medial anterior humeral head using an anterior approach.  Intra-
articular injection of Lidocaine was performed which flowed freely
and subsequently the joint was distended with 12  ml of a [DATE]
dilution of Multihance contrast.  The MR arthrogram solution was as
follows:  15 ml of omnipaque 180 contrast agent, 0.1 mL Multihance,
5 ml of 1% Lidocaine.  An end point was felt as well as the patient
experiencing pressure and the injection was discontinued, the
needle removed, and a sterile dressing applied. The patient was
taken to MRI for subsequent imaging.
The patient tolerated the procedure well and there were no
complications.

Fluoroscopy Time: 16 seconds
IMPRESSION: Successful left shoulder fluoroscopically guided injection.

## 2012-07-20 ENCOUNTER — Telehealth: Payer: Self-pay

## 2012-07-20 NOTE — Telephone Encounter (Signed)
Patient request refill of Adderall. Please call at [phone]

## 2012-07-20 NOTE — Telephone Encounter (Signed)
According to OV note pt needed an OV this month.

## 2012-07-21 ENCOUNTER — Ambulatory Visit (INDEPENDENT_AMBULATORY_CARE_PROVIDER_SITE_OTHER): Payer: Managed Care, Other (non HMO) | Admitting: Physician Assistant

## 2012-07-21 VITALS — BP 119/80 | HR 60 | Temp 98.3°F | Resp 16 | Ht 62.0 in | Wt 116.0 lb

## 2012-07-21 DIAGNOSIS — F909 Attention-deficit hyperactivity disorder, unspecified type: Secondary | ICD-10-CM

## 2012-07-21 MED ORDER — AMPHETAMINE-DEXTROAMPHETAMINE 20 MG PO TABS: 20.0000 mg | ORAL_TABLET | Freq: Two times a day (BID) | ORAL | Status: DC

## 2012-07-21 NOTE — Progress Notes (Signed)
Patient ID: Margy Sumler MRN: [id number], DOB: [birth year], 21 y.o. Date of Encounter: 07/21/2012, 1:47 PM  Primary Physician: No primary provider on file.  Chief Complaint: Follow up ADHD  HPI: 21 y.o. year old female with history below presents for follow up of ADHD. Doing well. No issues or complaints. Tolerating current dose of Adderall 20 mg bid. Takes daily. No decrease of appetite. No changes to sleeping habits. Good grades in school. Otherwise doing well. No chest pain, tachycardia, or palpitations. She would rather not take any medication, but does not get anything accomplished during the day if she does not take this. She states that both her mother and father have ADHD.     Past Medical History  Diagnosis Date  . Anxiety     anxiety attacks  . 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 ADHD (attention deficit hyperactivity disorder)      Home Meds: Prior to Admission medications   Medication Sig Start Date End Date Taking? Authorizing Provider  amphetamine-dextroamphetamine (ADDERALL) 20 MG tablet Take 1 tablet (20 mg total) by mouth 2 (two) times daily. 07/21/12  Yes Binnie Droessler Adria Devon, PA-C                  Allergies:  Allergies  Allergen Reactions  . Sulfa Antibiotics Swelling    Swelling of face    History   Social History  . Marital Status: Single    Spouse Name: N/A    Number of Children: N/A  . Years of Education: N/A   Occupational History  . Not on file.   Social History Main Topics  . Smoking status: Never Smoker   . Smokeless tobacco: Never Used  . Alcohol Use: Yes     Comment: occasionally  . Drug Use: No  . Sexually Active: Not on file   Other Topics Concern  . Not on file   Social History Narrative  . No narrative on file     Review of Systems: Constitutional: negative for chills, fever, night sweats, weight changes, or fatigue  HEENT: negative for vision changes or hearing loss Cardiovascular:  negative for chest pain, tachycardia, or palpitations Respiratory: negative for hemoptysis, wheezing, shortness of breath, or cough Neurologic: negative for headache   Physical Exam: Blood pressure 119/80, pulse 60, temperature 98.3 F (36.8 C), temperature source Oral, resp. rate 16, height 5\' 2"  (1.575 m), weight 116 lb (52.617 kg), last menstrual period 07/07/2012, SpO2 100.00%., Body mass index is 21.22 kg/(m^2). General: Well developed, well nourished, in no acute distress. Head: Normocephalic, atraumatic, eyes without discharge, sclera non-icteric, nares are without discharge.   Neck: Supple.  Lungs: Clear bilaterally to auscultation without wheezes, rales, or rhonchi. Breathing is unlabored. Heart: RRR with S1 S2. No murmurs, rubs, or gallops appreciated. Msk:  Strength and tone normal for age. Extremities/Skin: Warm and dry. No clubbing or cyanosis. No edema. No rashes or suspicious lesions. Neuro: Alert and oriented X 3. Moves all extremities spontaneously. Gait is normal. CNII-XII grossly in tact. Psych:  Responds to questions appropriately with a normal affect.     ASSESSMENT AND PLAN:  21 y.o. year old female with well controlled ADHD. -Well controlled  -Continue current medication -Refilled Adderall 20 mg 1 po bid #60 no RF -Adderall 20 mg 1 po bid #60 no RF May fill on 08/21/2012 -Adderall 20 mg 1 po bid #60 no RF May fill on 09/23/2012 -May call for 3 more months, then will need  office visit for further refills   Signed, Eula Listen, PA-C 07/21/2012 1:47 PM

## 2012-07-21 NOTE — Telephone Encounter (Signed)
Dr Merla Riches, [name], you last saw pt for ADD check on 08/12/11.

## 2012-07-21 NOTE — Telephone Encounter (Signed)
Called left mssg for pt.

## 2012-07-21 NOTE — Telephone Encounter (Signed)
Pt CB and stated that when she received the message needed OV, she tried to come in yesterday, but we had a 2 1/2 hour wait and she couldn't wait. She is in the middle of exam week and has to write essays, etc and has been w/out her Adderall for about a week. Pt reports she is very stable on it and has been a pt of Dr Doolittle's for a couple of years (according to chart 09/25/2009) and has seen him regularly Q 6 mos or sometimes he would allow even longer between visits. When she saw Dr Alwyn Ren in Sept, she thinks maybe he wrote in his notes that she needed f/up in 3 mos because she had given him h/o some anxiety in the past and she was a new pt to him. She states she is doing fine, no anxiety now and stable for a long time w/dose.   Dr Merla Riches, do you feel comfortable giving pt a Rx for her Adderall? Dr Alwyn Ren is not in today and he doesn't know the pt as well.

## 2012-10-11 ENCOUNTER — Telehealth: Payer: Self-pay

## 2012-10-11 DIAGNOSIS — F909 Attention-deficit hyperactivity disorder, unspecified type: Secondary | ICD-10-CM

## 2012-10-11 NOTE — Telephone Encounter (Signed)
Per Ryan's not, pt has to come in for additional refills on Adderall. I called pt to tell her this and she was confused and stated she only had to call for the next 6 months. Ryan please clarify.

## 2012-10-11 NOTE — Telephone Encounter (Signed)
Pt would like refill on her adderall and does not know if she needs to come in to be seen or not please call her at [phone]

## 2012-10-12 ENCOUNTER — Other Ambulatory Visit: Payer: Self-pay | Admitting: Radiology

## 2012-10-12 MED ORDER — AMPHETAMINE-DEXTROAMPHETAMINE 20 MG PO TABS: 20.0000 mg | ORAL_TABLET | Freq: Two times a day (BID) | ORAL | Status: DC

## 2012-10-12 NOTE — Telephone Encounter (Signed)
Called patient and LMOM that her prescriptions would be refilled as planned. Prescriptions refilled, printed, signed, and at the TL desk. Office visit needed in June.   Eula Listen, PA-C 10/12/2012 8:20 AM

## 2012-10-12 NOTE — Telephone Encounter (Signed)
Patient advised; Rx at front desk

## 2013-01-10 ENCOUNTER — Ambulatory Visit (INDEPENDENT_AMBULATORY_CARE_PROVIDER_SITE_OTHER): Payer: Managed Care, Other (non HMO) | Admitting: Emergency Medicine

## 2013-01-10 VITALS — BP 110/58 | HR 67 | Temp 99.0°F | Resp 16 | Ht 62.5 in | Wt 120.2 lb

## 2013-01-10 DIAGNOSIS — F909 Attention-deficit hyperactivity disorder, unspecified type: Secondary | ICD-10-CM

## 2013-01-10 MED ORDER — AMPHETAMINE-DEXTROAMPHETAMINE 20 MG PO TABS: 20.0000 mg | ORAL_TABLET | Freq: Two times a day (BID) | ORAL | Status: DC

## 2013-01-10 NOTE — Progress Notes (Signed)
[facility] [address] [place] [address] [phone]  Date:  01/10/2013   Name:  Karen Hensley   DOB:  December 02, 1990   MRN:  [id number]  PCP:  No primary provider on file.    Chief Complaint: Medication Refill   History of Present Illness:  Karen Hensley is a 22 y.o. very pleasant female patient who presents with the following:  History of ADD.  Tolerating medication well and symptoms controlled.  Doing well in school.  Sleeping well and has good appetite and weight stability on medication.  Denies other complaint or health concern today.   Patient Active Problem List   Diagnosis Date Noted  . ADHD (attention deficit hyperactivity disorder) 04/12/2012  . Dislocation of left shoulder joint 05/05/2011    Past Medical History  Diagnosis Date  . Anxiety     anxiety attacks  . 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 ADHD (attention deficit hyperactivity disorder)   . Allergy     Past Surgical History  Procedure Laterality Date  . External ear surgery  4-5 yrs. ago    cauliflower ear  . Shoulder arthroscopy  08/07/2011    Procedure: ARTHROSCOPY SHOULDER;  Surgeon: Eulas Post;  Location: [facility];  Service: Orthopedics;  Laterality: Left;  arthroscopy left shoulder with extensive debridement, capsulorrhaphy, [place] repair  . Shoulder surgery  2012    left shoulder    History  Substance Use Topics  . Smoking status: Never Smoker   . Smokeless tobacco: Never Used  . Alcohol Use: Yes     Comment: occasionally    Family History  Problem Relation Age of Onset  . Heart attack Paternal Grandfather   . Anxiety disorder Paternal Grandfather   . Depression Paternal Grandfather   . Diabetes Neg Hx   . Hyperlipidemia Neg Hx   . Hypertension Neg Hx   . Sudden death Neg Hx   . ADD / ADHD Mother   . Depression Father   . ADD / ADHD Father   . ADD / ADHD Brother   . Asthma Brother   .  Thyroid disease Maternal Grandmother   . Anxiety disorder Paternal Grandmother     Allergies  Allergen Reactions  . Sulfa Antibiotics Swelling    Swelling of face    Medication list has been reviewed and updated.  Current Outpatient Prescriptions on File Prior to Visit  Medication Sig Dispense Refill  . amphetamine-dextroamphetamine (ADDERALL) 20 MG tablet Take 1 tablet (20 mg total) by mouth 2 (two) times daily. May fill on 12/19/12  60 tablet  0   No current facility-administered medications on file prior to visit.    Review of Systems:  As per HPI, otherwise negative.    Physical Examination: Filed Vitals:   01/10/13 1547  BP: 110/58  Pulse: 67  Temp: 99 F (37.2 C)  Resp: 16   Filed Vitals:   01/10/13 1547  Height: 5' 2.5" (1.588 m)  Weight: 120 lb 3.2 oz (54.522 kg)   Body mass index is 21.62 kg/(m^2). Ideal Body Weight: Weight in (lb) to have BMI = 25: 138.6  GEN: WDWN, NAD, Non-toxic, A & O x 3 HEENT: Atraumatic, Normocephalic. Neck supple. No masses, No LAD. Ears and Nose: No external deformity. CV: RRR, No M/G/R. No JVD. No thrill. No extra heart sounds. PULM: CTA B, no wheezes, crackles, rhonchi. No retractions. No resp. distress. No accessory muscle use. ABD: S,  NT, ND, +BS. No rebound. No HSM. EXTR: No c/c/e NEURO Normal gait.  PSYCH: Normally interactive. Conversant. Not depressed or anxious appearing.  Calm demeanor.    Assessment and Plan: ADD Refill medications Follow up in three months  Signed,  Phillips Odor, MD

## 2013-01-10 NOTE — Patient Instructions (Addendum)
Attention Deficit Hyperactivity Disorder Attention deficit hyperactivity disorder (ADHD) is a problem with behavior issues based on the way the brain functions (neurobehavioral disorder). It is a common reason for behavior and academic problems in school. CAUSES  The cause of ADHD is unknown in most cases. It may run in families. It sometimes can be associated with learning disabilities and other behavioral problems. SYMPTOMS  There are 3 types of ADHD. The 3 types and some of the symptoms include:  Inattentive  Gets bored or distracted easily.  Loses or forgets things. Forgets to hand in homework.  Has trouble organizing or completing tasks.  Difficulty staying on task.  An inability to organize daily tasks and school work.  Leaving projects, chores, or homework unfinished.  Trouble paying attention or responding to details. Careless mistakes.  Difficulty following directions. Often seems like is not listening.  Dislikes activities that require sustained attention (like chores or homework).  Hyperactive-impulsive  Feels like it is impossible to sit still or stay in a seat. Fidgeting with hands and feet.  Trouble waiting turn.  Talking too much or out of turn. Interruptive.  Speaks or acts impulsively.  Aggressive, disruptive behavior.  Constantly busy or on the go, noisy.  Combined  Has symptoms of both of the above. Often children with ADHD feel discouraged about themselves and with school. They often perform well below their abilities in school. These symptoms can cause problems in home, school, and in relationships with peers. As children get older, the excess motor activities can calm down, but the problems with paying attention and staying organized persist. Most children do not outgrow ADHD but with good treatment can learn to cope with the symptoms. DIAGNOSIS  When ADHD is suspected, the diagnosis should be made by professionals trained in ADHD.  Diagnosis will  include:  Ruling out other reasons for the child's behavior.  The caregivers will check with the child's school and check their medical records.  They will talk to teachers and parents.  Behavior rating scales for the child will be filled out by those dealing with the child on a daily basis. A diagnosis is made only after all information has been considered. TREATMENT  Treatment usually includes behavioral treatment often along with medicines. It may include stimulant medicines. The stimulant medicines decrease impulsivity and hyperactivity and increase attention. Other medicines used include antidepressants and certain blood pressure medicines. Most experts agree that treatment for ADHD should address all aspects of the child's functioning. Treatment should not be limited to the use of medicines alone. Treatment should include structured classroom management. The parents must receive education to address rewarding good behavior, discipline, and limit-setting. Tutoring or behavioral therapy or both should be available for the child. If untreated, the disorder can have long-term serious effects into adolescence and adulthood. HOME CARE INSTRUCTIONS   Often with ADHD there is a lot of frustration among the family in dealing with the illness. There is often blame and anger that is not warranted. This is a life long illness. There is no way to prevent ADHD. In many cases, because the problem affects the family as a whole, the entire family may need help. A therapist can help the family find better ways to handle the disruptive behaviors and promote change. If the child is young, most of the therapist's work is with the parents. Parents will learn techniques for coping with and improving their child's behavior. Sometimes only the child with the ADHD needs counseling. Your caregivers can help   you make these decisions.  Children with ADHD may need help in organizing. Some helpful tips include:  Keep  routines the same every day from wake-up time to bedtime. Schedule everything. This includes homework and playtime. This should include outdoor and indoor recreation. Keep the schedule on the refrigerator or a bulletin board where it is frequently seen. Mark schedule changes as far in advance as possible.  Have a place for everything and keep everything in its place. This includes clothing, backpacks, and school supplies.  Encourage writing down assignments and bringing home needed books.  Offer your child a well-balanced diet. Breakfast is especially important for school performance. Children should avoid drinks with caffeine including:  Soft drinks.  Coffee.  Tea.  However, some older children (adolescents) may find these drinks helpful in improving their attention.  Children with ADHD need consistent rules that they can understand and follow. If rules are followed, give small rewards. Children with ADHD often receive, and expect, criticism. Look for good behavior and praise it. Set realistic goals. Give clear instructions. Look for activities that can foster success and self-esteem. Make time for pleasant activities with your child. Give lots of affection.  Parents are their children's greatest advocates. Learn as much as possible about ADHD. This helps you become a stronger and better advocate for your child. It also helps you educate your child's teachers and instructors if they feel inadequate in these areas. Parent support groups are often helpful. A national group with local chapters is called CHADD (Children and Adults with Attention Deficit Hyperactivity Disorder). PROGNOSIS  There is no cure for ADHD. Children with the disorder seldom outgrow it. Many find adaptive ways to accommodate the ADHD as they mature. SEEK MEDICAL CARE IF:  Your child has repeated muscle twitches, cough or speech outbursts.  Your child has sleep problems.  Your child has a marked loss of  appetite.  Your child develops depression.  Your child has new or worsening behavioral problems.  Your child develops dizziness.  Your child has a racing heart.  Your child has stomach pains.  Your child develops headaches. Document Released: 07/17/2002 Document Revised: 10/19/2011 Document Reviewed: 02/27/2008 ExitCare Patient Information 2014 [employer].  

## 2013-01-30 ENCOUNTER — Encounter: Payer: Managed Care, Other (non HMO) | Admitting: Physician Assistant

## 2013-01-30 NOTE — Progress Notes (Signed)
   Patient ID: Karen Hensley MRN: [id number], DOB: 08-17-1990, 22 y.o. Date of Encounter: 01/30/2013, 8:28 AM  Primary Physician: No primary provider on file.  Chief Complaint: Follow up ADHD  HPI: 22 y.o. female with history below presents for follow up of ADHD. Doing well. No issues or complaints. Tolerating current dose of Adderall 20 mg bid. Takes daily. No chest pain, chest tightness, palpitations, SOB. No decrease of appetite. No changes to sleeping habits. Good grades in school. Otherwise doing well.    Past Medical History  Diagnosis Date  . Anxiety     anxiety attacks  . 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 ADHD (attention deficit hyperactivity disorder)   . Allergy      Home Meds: Prior to Admission medications   Medication Sig Start Date End Date Taking? Authorizing Provider  amphetamine-dextroamphetamine (ADDERALL) 20 MG tablet Take 1 tablet (20 mg total) by mouth 2 (two) times daily. DO NOT FILL BEFORE 03/12/13 01/10/13   Phillips Odor, MD    Allergies:  Allergies  Allergen Reactions  . Sulfa Antibiotics Swelling    Swelling of face    History   Social History  . Marital Status: Single    Spouse Name: N/A    Number of Children: N/A  . Years of Education: N/A   Occupational History  . Not on file.   Social History Main Topics  . Smoking status: Never Smoker   . Smokeless tobacco: Never Used  . Alcohol Use: Yes     Comment: occasionally  . Drug Use: No  . Sexually Active: Not on file   Other Topics Concern  . Not on file   Social History Narrative   Significant other. Education: [employer]. Exercise: [employer].     Review of Systems: Constitutional: negative for chills, fever, or fatigue  HEENT: negative for vision changes or hearing loss Cardiovascular: negative for chest pain, tachycardia, or palpitations Respiratory: negative for wheezing, shortness of breath, dyspnea, tachypnea, or  cough Abdominal: negative for abdominal pain, nausea, vomiting, or diarrhea Dermatological: negative for rash Neurologic: negative for headache    Physical Exam: Last menstrual period 01/01/2013., There is no weight on file to calculate BMI. General: Well developed, well nourished, in no acute distress. Head: Normocephalic, atraumatic, eyes without discharge, sclera non-icteric, nares are without discharge. Bilateral auditory canals clear, TM's are without perforation, pearly grey and translucent with reflective cone of light bilaterally. Oral cavity moist, posterior pharynx without exudate, erythema, peritonsillar abscess, or post nasal drip.  Neck: Supple. No thyromegaly. Full ROM. No lymphadenopathy. Lungs: Clear bilaterally to auscultation without wheezes, rales, or rhonchi. Breathing is unlabored. Heart: RRR with S1 S2. No murmurs, rubs, or gallops appreciated. Msk:  Strength and tone normal for age. Extremities/Skin: Warm and dry. No clubbing or cyanosis. No edema. No rashes or suspicious lesions. Neuro: Alert and oriented X 3. Moves all extremities spontaneously. Gait is normal. CNII-XII grossly in tact. Psych:  Responds to questions appropriately with a normal affect.     ASSESSMENT AND PLAN:  22 y.o. female with well controlled ADHD. -Continue current medication -Refilled    Signed, Eula Listen, PA-C 01/30/2013 8:28 AM   This encounter was created in error - please disregard.

## 2013-03-06 ENCOUNTER — Telehealth: Payer: Self-pay | Admitting: Radiology

## 2013-03-06 NOTE — Telephone Encounter (Signed)
BIlling: Would you be able to take care of this? Let me know if I can be of assistance. Thanks!  The following email was submitted to your website from [facility] [place], I am Karen Hensley Mother- in reference to acct [id number]. I just received a bill for $175. for services rendered 01-10-13. [name] has double coverage, [place] and [address]. It appears [employer] paid nothing on this claim. Could you please tell me why? [place] primary is with her Father [name], (my exhusband) so I have no idea what the controversy is. Please let me know and I will contact him asap. Afterwards could you bill to secondary? Thanks,Karen Hensley Date of birth: 04/26/1965

## 2013-03-15 NOTE — Telephone Encounter (Signed)
Karen Hensley, Did this get taken care of? If so, can you please close the encounter. It is showing up in my in basket as open encounter. Thanks!

## 2013-04-20 ENCOUNTER — Ambulatory Visit (INDEPENDENT_AMBULATORY_CARE_PROVIDER_SITE_OTHER): Payer: 59 | Admitting: Emergency Medicine

## 2013-04-20 VITALS — BP 116/76 | HR 65 | Temp 99.0°F | Resp 16 | Wt 122.6 lb

## 2013-04-20 DIAGNOSIS — F909 Attention-deficit hyperactivity disorder, unspecified type: Secondary | ICD-10-CM

## 2013-04-20 MED ORDER — AMPHETAMINE-DEXTROAMPHETAMINE 20 MG PO TABS: 20.0000 mg | ORAL_TABLET | Freq: Two times a day (BID) | ORAL | Status: DC

## 2013-04-20 NOTE — Progress Notes (Signed)
[facility] [address] [place] [address] [phone]  Date:  04/20/2013   Name:  Karen Hensley   DOB:  1991/04/21   MRN:  [id number]  PCP:  No PCP Per Patient    Chief Complaint: Medication Refill   History of Present Illness:  Karen Hensley is a 22 y.o. very pleasant female patient who presents with the following:  History of ADD.  Ready to graduate from college.  Tolerating medication and has done well with the present dose.  Sleeping well and eating.  No improvement with over the counter medications or other home remedies. Denies other complaint or health concern today.   Patient Active Problem List   Diagnosis Date Noted  . ADHD (attention deficit hyperactivity disorder) 04/12/2012  . Dislocation of left shoulder joint 05/05/2011    Past Medical History  Diagnosis Date  . Anxiety     anxiety attacks  . 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 ADHD (attention deficit hyperactivity disorder)   . Allergy     Past Surgical History  Procedure Laterality Date  . External ear surgery  4-5 yrs. ago    cauliflower ear  . Shoulder arthroscopy  08/07/2011    Procedure: ARTHROSCOPY SHOULDER;  Surgeon: Eulas Post;  Location: [facility];  Service: Orthopedics;  Laterality: Left;  arthroscopy left shoulder with extensive debridement, capsulorrhaphy, Bankhardt repair  . Shoulder surgery  2012    left shoulder    History  Substance Use Topics  . Smoking status: Never Smoker   . Smokeless tobacco: Never Used  . Alcohol Use: Yes     Comment: occasionally    Family History  Problem Relation Age of Onset  . Heart attack Paternal Grandfather   . Anxiety disorder Paternal Grandfather   . Depression Paternal Grandfather   . Diabetes Neg Hx   . Hyperlipidemia Neg Hx   . Hypertension Neg Hx   . Sudden death Neg Hx   . ADD / ADHD Mother   . Depression Father   . ADD / ADHD Father   . ADD /  ADHD Brother   . Asthma Brother   . Thyroid disease Maternal Grandmother   . Anxiety disorder Paternal Grandmother     Allergies  Allergen Reactions  . Sulfa Antibiotics Swelling    Swelling of face    Medication list has been reviewed and updated.  Current Outpatient Prescriptions on File Prior to Visit  Medication Sig Dispense Refill  . amphetamine-dextroamphetamine (ADDERALL) 20 MG tablet Take 1 tablet (20 mg total) by mouth 2 (two) times daily. DO NOT FILL BEFORE 03/12/13  60 tablet  0   No current facility-administered medications on file prior to visit.    Review of Systems:  As per HPI, otherwise negative.    Physical Examination: Filed Vitals:   04/20/13 1702  BP: 116/76  Pulse: 65  Temp: 99 F (37.2 C)  Resp: 16   Filed Vitals:   04/20/13 1702  Weight: 122 lb 9.6 oz (55.611 kg)   Body mass index is 22.05 kg/(m^2). Ideal Body Weight:     GEN: WDWN, NAD, Non-toxic, Alert & Oriented x 3 HEENT: Atraumatic, Normocephalic.  Ears and Nose: No external deformity. EXTR: No clubbing/cyanosis/edema NEURO: Normal gait.  PSYCH: Normally interactive. Conversant. Not depressed or anxious appearing.  Calm demeanor.    Assessment and Plan: ADD Reviewed records Refill meds   Signed,  Phillips Odor, MD

## 2013-04-20 NOTE — Patient Instructions (Addendum)
Attention Deficit Hyperactivity Disorder Attention deficit hyperactivity disorder (ADHD) is a problem with behavior issues based on the way the brain functions (neurobehavioral disorder). It is a common reason for behavior and academic problems in school. CAUSES  The cause of ADHD is unknown in most cases. It may run in families. It sometimes can be associated with learning disabilities and other behavioral problems. SYMPTOMS  There are 3 types of ADHD. The 3 types and some of the symptoms include:  Inattentive  Gets bored or distracted easily.  Loses or forgets things. Forgets to hand in homework.  Has trouble organizing or completing tasks.  Difficulty staying on task.  An inability to organize daily tasks and school work.  Leaving projects, chores, or homework unfinished.  Trouble paying attention or responding to details. Careless mistakes.  Difficulty following directions. Often seems like is not listening.  Dislikes activities that require sustained attention (like chores or homework).  Hyperactive-impulsive  Feels like it is impossible to sit still or stay in a seat. Fidgeting with hands and feet.  Trouble waiting turn.  Talking too much or out of turn. Interruptive.  Speaks or acts impulsively.  Aggressive, disruptive behavior.  Constantly busy or on the go, noisy.  Combined  Has symptoms of both of the above. Often children with ADHD feel discouraged about themselves and with school. They often perform well below their abilities in school. These symptoms can cause problems in home, school, and in relationships with peers. As children get older, the excess motor activities can calm down, but the problems with paying attention and staying organized persist. Most children do not outgrow ADHD but with good treatment can learn to cope with the symptoms. DIAGNOSIS  When ADHD is suspected, the diagnosis should be made by professionals trained in ADHD.  Diagnosis will  include:  Ruling out other reasons for the child's behavior.  The caregivers will check with the child's school and check their medical records.  They will talk to teachers and parents.  Behavior rating scales for the child will be filled out by those dealing with the child on a daily basis. A diagnosis is made only after all information has been considered. TREATMENT  Treatment usually includes behavioral treatment often along with medicines. It may include stimulant medicines. The stimulant medicines decrease impulsivity and hyperactivity and increase attention. Other medicines used include antidepressants and certain blood pressure medicines. Most experts agree that treatment for ADHD should address all aspects of the child's functioning. Treatment should not be limited to the use of medicines alone. Treatment should include structured classroom management. The parents must receive education to address rewarding good behavior, discipline, and limit-setting. Tutoring or behavioral therapy or both should be available for the child. If untreated, the disorder can have long-term serious effects into adolescence and adulthood. HOME CARE INSTRUCTIONS   Often with ADHD there is a lot of frustration among the family in dealing with the illness. There is often blame and anger that is not warranted. This is a life long illness. There is no way to prevent ADHD. In many cases, because the problem affects the family as a whole, the entire family may need help. A therapist can help the family find better ways to handle the disruptive behaviors and promote change. If the child is young, most of the therapist's work is with the parents. Parents will learn techniques for coping with and improving their child's behavior. Sometimes only the child with the ADHD needs counseling. Your caregivers can help   you make these decisions.  Children with ADHD may need help in organizing. Some helpful tips include:  Keep  routines the same every day from wake-up time to bedtime. Schedule everything. This includes homework and playtime. This should include outdoor and indoor recreation. Keep the schedule on the refrigerator or a bulletin board where it is frequently seen. Mark schedule changes as far in advance as possible.  Have a place for everything and keep everything in its place. This includes clothing, backpacks, and school supplies.  Encourage writing down assignments and bringing home needed books.  Offer your child a well-balanced diet. Breakfast is especially important for school performance. Children should avoid drinks with caffeine including:  Soft drinks.  Coffee.  Tea.  However, some older children (adolescents) may find these drinks helpful in improving their attention.  Children with ADHD need consistent rules that they can understand and follow. If rules are followed, give small rewards. Children with ADHD often receive, and expect, criticism. Look for good behavior and praise it. Set realistic goals. Give clear instructions. Look for activities that can foster success and self-esteem. Make time for pleasant activities with your child. Give lots of affection.  Parents are their children's greatest advocates. Learn as much as possible about ADHD. This helps you become a stronger and better advocate for your child. It also helps you educate your child's teachers and instructors if they feel inadequate in these areas. Parent support groups are often helpful. A national group with local chapters is called CHADD (Children and Adults with Attention Deficit Hyperactivity Disorder). PROGNOSIS  There is no cure for ADHD. Children with the disorder seldom outgrow it. Many find adaptive ways to accommodate the ADHD as they mature. SEEK MEDICAL CARE IF:  Your child has repeated muscle twitches, cough or speech outbursts.  Your child has sleep problems.  Your child has a marked loss of  appetite.  Your child develops depression.  Your child has new or worsening behavioral problems.  Your child develops dizziness.  Your child has a racing heart.  Your child has stomach pains.  Your child develops headaches. Document Released: 07/17/2002 Document Revised: 10/19/2011 Document Reviewed: 02/27/2008 ExitCare Patient Information 2014 [employer].  

## 2013-07-13 ENCOUNTER — Telehealth: Payer: Self-pay

## 2013-07-13 NOTE — Telephone Encounter (Signed)
Spoke with patient about needing to return to the office for these refill. States that she remember when it was every 6 months that she came in for this as opposed to 3; wants to know why this has changed. Told her that she needed to return for this. Advised her that I would let provider know that it is end of semester and that she has difficulty getting into the office. Made her aware of dates that provider would be in office. Patient stated that she was not having any current problems with the dose prescribed.

## 2013-07-13 NOTE — Telephone Encounter (Signed)
Has a prescription for Adderall 20 mg.  She needs to pick up her 3 month prescriptions and doesn't know whether or not she needs an ov.  She was not sure which provider she saw last.  Her best # is [phone].

## 2013-07-13 NOTE — Telephone Encounter (Signed)
Please see my last response

## 2013-07-13 NOTE — Telephone Encounter (Signed)
Last visit was in Sept.

## 2013-07-13 NOTE — Telephone Encounter (Signed)
Called her to advise.  

## 2013-07-13 NOTE — Telephone Encounter (Signed)
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

## 2013-07-13 NOTE — Telephone Encounter (Signed)
Must be seen for repeat schedule 2 prescription

## 2013-07-20 ENCOUNTER — Ambulatory Visit (INDEPENDENT_AMBULATORY_CARE_PROVIDER_SITE_OTHER): Payer: 59 | Admitting: Physician Assistant

## 2013-07-20 VITALS — BP 112/77 | HR 75 | Temp 98.7°F | Resp 18 | Wt 121.0 lb

## 2013-07-20 DIAGNOSIS — F909 Attention-deficit hyperactivity disorder, unspecified type: Secondary | ICD-10-CM

## 2013-07-20 MED ORDER — AMPHETAMINE-DEXTROAMPHETAMINE 20 MG PO TABS: 20.0000 mg | ORAL_TABLET | Freq: Two times a day (BID) | ORAL | Status: DC

## 2013-07-20 NOTE — Progress Notes (Signed)
Patient ID: Karen Hensley MRN: [id number], DOB: [birth year], 22 y.o. Date of Encounter: 07/20/2013, 5:47 PM  Primary Physician: No PCP Per Patient  Chief Complaint: Follow up ADHD  HPI: 22 y.o. female with history below presents for follow up of ADHD. Doing well. No issues or complaints. Tolerating current dose of Adderall 20 mg 1 po bid. Takes daily. No chest pain, palpitations, or SOB. No decrease of appetite. No changes to sleeping habits. Good grades in school. Added on an art minor. Feels like the Adderall may block some of her creative process, but the benefit outweighs this side effect. 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 Otherwise doing well.    Past Medical History  Diagnosis Date  . Anxiety     anxiety attacks  . 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 ADHD (attention deficit hyperactivity disorder)   . Allergy      Home Meds: Prior to Admission medications   Medication Sig Start Date End Date Taking? Authorizing Provider  amphetamine-dextroamphetamine (ADDERALL) 20 MG tablet Take 1 tablet (20 mg total) by mouth 2 (two) times daily. May fill on or after 09/20/2013 07/20/13  Yes Sebastion Jun Adria Devon, PA-C  etonogestrel-ethinyl estradiol (NUVARING) 0.12-0.015 MG/24HR vaginal ring Place 1 each vaginally every 28 (twenty-eight) days. Insert vaginally and leave in place for 3 consecutive weeks, then remove for 1 week.   Yes Historical Provider, MD    Allergies:  Allergies  Allergen Reactions  . Sulfa Antibiotics Swelling    Swelling of face    History   Social History  . Marital Status: Single    Spouse Name: N/A    Number of Children: N/A  . Years of Education: N/A   Occupational History  . Not on file.   Social History Main Topics  . Smoking status: Never Smoker   . Smokeless tobacco: Never Used  . Alcohol Use: Yes     Comment: occasionally  . Drug Use: No  . Sexual  Activity: Not on file   Other Topics Concern  . Not on file   Social History Narrative   Significant other. Education: [employer]. Exercise: [employer].     Review of Systems: Constitutional: negative for chills, fever, or fatigue  HEENT: negative for vision changes or hearing loss Cardiovascular: negative for chest pain, tachycardia, or palpitations Respiratory: negative for wheezing, shortness of breath, dyspnea, tachypnea, or cough Abdominal: negative for abdominal pain, nausea, vomiting, or diarrhea Dermatological: negative for rash Neurologic: negative for headache    Physical Exam: Blood pressure 112/77, pulse 75, temperature 98.7 F (37.1 C), temperature source Oral, resp. rate 18, weight 121 lb (54.885 kg), last menstrual period 07/06/2013, SpO2 100.00%., Body mass index is 21.76 kg/(m^2). General: Well developed, well nourished, in no acute distress. Head: Normocephalic, atraumatic, eyes without discharge, sclera non-icteric, nares are without discharge. Bilateral auditory canals clear, TM's are without perforation, pearly grey and translucent with reflective cone of light bilaterally. Oral cavity moist, posterior pharynx without exudate, erythema, peritonsillar abscess, or post nasal drip.  Neck: Supple. No thyromegaly. Full ROM. No lymphadenopathy. Lungs: Clear bilaterally to auscultation without wheezes, rales, or rhonchi. Breathing is unlabored. Heart: RRR with S1 S2. No murmurs, rubs, or gallops appreciated. Msk:  Strength and tone normal for age. Extremities/Skin: Warm and dry. No clubbing or cyanosis. No edema. No rashes or suspicious lesions. Neuro: Alert and oriented X 3. Moves all extremities spontaneously.  Gait is normal. CNII-XII grossly in tact. Psych:  Responds to questions appropriately with a normal affect.     ASSESSMENT AND PLAN:  22 y.o. female with well controlled ADHD. -Continue current medication -Refilled Adderall -Adderall 20 mg 1 po bid #60  no RF -Adderall 20 mg 1 po bid #60 no RF May fill on or after 08/20/13 -Adderall 20 mg 1 po bid #60 no RF May fill on or after 09/20/13 -May call for the next 3 months, then follow up   Signed, Eula Listen, PA-C [facility] [place], [place] [address] [phone] 07/20/2013 5:47 PM

## 2013-10-17 ENCOUNTER — Telehealth: Payer: Self-pay

## 2013-10-17 DIAGNOSIS — F909 Attention-deficit hyperactivity disorder, unspecified type: Secondary | ICD-10-CM

## 2013-10-17 NOTE — Telephone Encounter (Signed)
Patient needs refill on adderall rx 3 month supply. Says she needs by tomorrow. Sees [facility] Dunn. Cb# [phone]

## 2013-10-18 MED ORDER — AMPHETAMINE-DEXTROAMPHETAMINE 20 MG PO TABS: 20.0000 mg | ORAL_TABLET | Freq: Two times a day (BID) | ORAL | Status: DC

## 2013-10-18 NOTE — Telephone Encounter (Signed)
Notified pt ready. 

## 2013-10-18 NOTE — Telephone Encounter (Signed)
Prescriptions refilled, printed, signed, and in the box.

## 2014-01-19 ENCOUNTER — Ambulatory Visit (INDEPENDENT_AMBULATORY_CARE_PROVIDER_SITE_OTHER): Payer: 59 | Admitting: Physician Assistant

## 2014-01-19 VITALS — BP 128/76 | HR 80 | Temp 98.3°F | Resp 16 | Ht 62.0 in | Wt 116.0 lb

## 2014-01-19 DIAGNOSIS — F909 Attention-deficit hyperactivity disorder, unspecified type: Secondary | ICD-10-CM

## 2014-01-19 MED ORDER — AMPHETAMINE-DEXTROAMPHETAMINE 30 MG PO TABS: 30.0000 mg | ORAL_TABLET | Freq: Two times a day (BID) | ORAL | Status: DC

## 2014-01-19 NOTE — Progress Notes (Signed)
Subjective:    Patient ID: Karen Hensley, female    DOB: [birth year], 23 y.o.   MRN: [id number]  HPI Primary Physician: No PCP Per Patient  Chief Complaint: Medication refill Adderall 20 mg bid  HPI: 23 y.o. female with history below presents for medication refill of Adderall 20 mg bid. Doing well. No issues or complaints. Tolerating current dose of Adderall 20 mg bid without issues. Takes daily. No chest pain, chest pressure, palpitations, or SOB. No decrease in appetite. No changes in sleeping habits.   Has been taking 1.5 Adderall bid due increased job demands. Working 8-6. Has 5 job titles.    Past Medical History  Diagnosis Date  . Anxiety     anxiety attacks  . 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 ADHD (attention deficit hyperactivity disorder)   . Allergy      Home Meds: Prior to Admission medications   Medication Sig Start Date End Date Taking? Authorizing Provider  amphetamine-dextroamphetamine (ADDERALL) 20 MG tablet Take 1 tablet (20 mg total) by mouth 2 (two) times daily. May fill on or after 12/18/13 10/18/13  Yes Ryan Adria DevonM Dunn, PA-C  etonogestrel-ethinyl estradiol (NUVARING) 0.12-0.015 MG/24HR vaginal ring Place 1 each vaginally every 28 (twenty-eight) days. Insert vaginally and leave in place for 3 consecutive weeks, then remove for 1 week.    Historical Provider, MD    Allergies:  Allergies  Allergen Reactions  . Sulfa Antibiotics Swelling    Swelling of face    History   Social History  . Marital Status: Single    Spouse Name: N/A    Number of Children: N/A  . Years of Education: N/A   Occupational History  . Not on file.   Social History Main Topics  . Smoking status: Never Smoker   . Smokeless tobacco: Never Used  . Alcohol Use: Yes     Comment: occasionally  . Drug Use: No  . Sexual Activity: Not on file   Other Topics Concern  . Not on file   Social History Narrative   Significant other. Education:  [employer]. Exercise: [employer].     Review of Systems  Constitutional: Negative for fatigue.  Respiratory: Negative for shortness of breath.   Cardiovascular: Negative for chest pain and palpitations.  Psychiatric/Behavioral: Negative for sleep disturbance.       Objective:   Physical Exam  Physical Exam: Blood pressure 128/76, pulse 80, temperature 98.3 F (36.8 C), resp. rate 16, height 5\' 2"  (1.575 m), weight 116 lb (52.617 kg), last menstrual period 01/12/2014, SpO2 99.00%., Body mass index is 21.21 kg/(m^2). General: Well developed, well nourished, in no acute distress. Head: Normocephalic, atraumatic, eyes without discharge, sclera non-icteric, nares are without discharge. Bilateral auditory canals clear, TM's are without perforation, pearly grey and translucent with reflective cone of light bilaterally. Oral cavity moist, posterior pharynx without exudate, erythema, peritonsillar abscess, or post nasal drip. Uvula midline.   Neck: Supple. No thyromegaly. Full ROM. No lymphadenopathy. Lungs: Clear bilaterally to auscultation without wheezes, rales, or rhonchi. Breathing is unlabored. Heart: RRR with S1 S2. No murmurs, rubs, or gallops appreciated. Msk:  Strength and tone normal for age. Extremities/Skin: Warm and dry. No clubbing or cyanosis. No edema. No rashes or suspicious lesions. Neuro: Alert and oriented X 3. Moves all extremities spontaneously. Gait is normal. CNII-XII grossly in tact. Psych:  Responds to questions appropriately with a normal affect.        Assessment & Plan:  23 year old female with ADHD -Adderall 30 mg bid #60 no RF -Adderall 30 mg bid #60 no RF, may fill on or after 02/18/14 -Adderall 30 mg bid #60 no RF, may fill on or after 03/21/14 -May call for 3 more months   Eula Listenyan Dunn, MHS, PA-C [facility] [address] [place], [place] [id number] [phone] [facility] 01/19/2014 6:22 PM

## 2014-04-23 ENCOUNTER — Telehealth: Payer: Self-pay

## 2014-04-23 DIAGNOSIS — F909 Attention-deficit hyperactivity disorder, unspecified type: Secondary | ICD-10-CM

## 2014-04-23 MED ORDER — AMPHETAMINE-DEXTROAMPHETAMINE 30 MG PO TABS: 30.0000 mg | ORAL_TABLET | Freq: Two times a day (BID) | ORAL | Status: DC

## 2014-04-23 NOTE — Telephone Encounter (Signed)
Pt would like a refill on adderall. Best# [phone]

## 2014-04-23 NOTE — Telephone Encounter (Signed)
Rx's printed.  She'll need OV (with a new PCP, since Eula Listen has left) for the next set.  Meds ordered this encounter  Medications  . amphetamine-dextroamphetamine (ADDERALL) 30 MG tablet    Sig: Take 1 tablet by mouth 2 (two) times daily.    Dispense:  60 tablet    Refill:  0    Order Specific Question:  Supervising Provider    Answer:  DOOLITTLE, ROBERT P [[id number]]  . amphetamine-dextroamphetamine (ADDERALL) 30 MG tablet    Sig: Take 1 tablet by mouth 2 (two) times daily. May fill 30 days after date on prescription.    Dispense:  60 tablet    Refill:  0    Order Specific Question:  Supervising Provider    Answer:  DOOLITTLE, ROBERT P [[id number]]  . amphetamine-dextroamphetamine (ADDERALL) 30 MG tablet    Sig: Take 1 tablet by mouth 2 (two) times daily. May fill 60 days after date on prescription.    Dispense:  60 tablet    Refill:  0    Order Specific Question:  Supervising Provider    Answer:  DOOLITTLE, ROBERT P [[id number]]

## 2014-04-24 NOTE — Telephone Encounter (Signed)
Lm to advise pt- needs OV for further refills. Put in pick up drawer with note to pt to schedule an appt.

## 2014-07-17 ENCOUNTER — Ambulatory Visit: Payer: 59 | Admitting: Family Medicine

## 2014-07-31 ENCOUNTER — Ambulatory Visit (INDEPENDENT_AMBULATORY_CARE_PROVIDER_SITE_OTHER): Payer: 59 | Admitting: Family Medicine

## 2014-07-31 ENCOUNTER — Encounter: Payer: Self-pay | Admitting: Family Medicine

## 2014-07-31 VITALS — BP 117/73 | HR 66 | Temp 97.8°F | Resp 16 | Ht 62.5 in | Wt 119.0 lb

## 2014-07-31 DIAGNOSIS — F909 Attention-deficit hyperactivity disorder, unspecified type: Secondary | ICD-10-CM

## 2014-07-31 MED ORDER — AMPHETAMINE-DEXTROAMPHETAMINE 30 MG PO TABS: 30.0000 mg | ORAL_TABLET | Freq: Two times a day (BID) | ORAL | Status: DC

## 2014-07-31 NOTE — Progress Notes (Signed)
Subjective:    Patient ID: Karen Hensley, female    DOB: 1990-12-23, 23 y.o.   MRN: [id number]  HPI Patient presents today for refill of medication. She saw Eula Listenyan Dunn, PA-C previously and is here to establish care with new provider.   She has been taking Adderall 30 mg bid for several months. She has worked on her dosage of medication and timing of taking it to find the time that works best for her.  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   Has stopped using Nuva Ring and has an appointment with gyn to discuss birth control options. Currently using condoms.  Sleeps well if she takes her meds at the right time.   Past Medical History  Diagnosis Date  . Anxiety     anxiety attacks  . 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 ADHD (attention deficit hyperactivity disorder)   . Allergy    Past Surgical History  Procedure Laterality Date  . External ear surgery  4-5 yrs. ago    cauliflower ear  . Shoulder arthroscopy  08/07/2011    Procedure: ARTHROSCOPY SHOULDER;  Surgeon: Eulas PostJoshua P Landau;  Location: [facility];  Service: Orthopedics;  Laterality: Left;  arthroscopy left shoulder with extensive debridement, capsulorrhaphy, Bankhardt repair  . Shoulder surgery  2012    left shoulder   Family History  Problem Relation Age of Onset  . Heart attack Paternal Grandfather   . Anxiety disorder Paternal Grandfather   . Depression Paternal Grandfather   . Diabetes Neg Hx   . Hyperlipidemia Neg Hx   . Hypertension Neg Hx   . Sudden death Neg Hx   . ADD / ADHD Mother   . Depression Father   . ADD / ADHD Father   . ADD / ADHD Brother   . Asthma Brother   . Thyroid disease Maternal Grandmother   . Anxiety disorder Paternal Grandmother    History  Substance Use Topics  . Smoking status: Never Smoker   . Smokeless tobacco: Never Used  . Alcohol Use: Yes     Comment: occasionally   Review of Systems No chest pain, no SOB, some  menstrual headaches    Objective:   Physical Exam  Constitutional: She is oriented to person, place, and time. She appears well-developed and well-nourished.  HENT:  Head: Normocephalic and atraumatic.  Right Ear: External ear normal.  Left Ear: External ear normal.  Nose: Nose normal.  Mouth/Throat: Oropharynx is clear and moist.  Eyes: Conjunctivae are normal.  Neck: Normal range of motion. Neck supple.  Cardiovascular: Normal rate, regular rhythm and normal heart sounds.   Pulmonary/Chest: Effort normal and breath sounds normal.  Musculoskeletal: Normal range of motion.  Lymphadenopathy:    She has no cervical adenopathy.  Neurological: She is alert and oriented to person, place, and time.  Skin: Skin is warm and dry.  Psychiatric: She has a normal mood and affect. Her behavior is normal. Judgment and thought content normal.  Vitals reviewed. BP 117/73 mmHg  Pulse 66  Temp(Src) 97.8 F (36.6 C)  Resp 16  Ht 5' 2.5" (1.588 m)  Wt 119 lb (53.978 kg)  BMI 21.41 kg/m2  SpO2 100%  LMP 07/30/2014    Assessment & Plan:  1. Attention deficit hyperactivity disorder (ADHD), unspecified ADHD type - amphetamine-dextroamphetamine (ADDERALL) 30 MG tablet; Take 1 tablet by mouth 2 (two) times daily. May fill 30 days after date on prescription.  Dispense: 60  tablet; Refill: 0 - amphetamine-dextroamphetamine (ADDERALL) 30 MG tablet; Take 1 tablet by mouth 2 (two) times daily.  Dispense: 60 tablet; Refill: 0 - amphetamine-dextroamphetamine (ADDERALL) 30 MG tablet; Take 1 tablet by mouth 2 (two) times daily. May fill 60 days after date on prescription.  Dispense: 60 tablet; Refill: 0  - Can call me in 3 months for refills, follow up in 6 months, sooner if needed.  Emi Belfasteborah B. Ellanie Oppedisano, FNP-BC  [facility], [facility]  07/31/2014 4:34 PM

## 2014-10-15 ENCOUNTER — Telehealth: Payer: Self-pay

## 2014-10-15 ENCOUNTER — Other Ambulatory Visit: Payer: Self-pay | Admitting: Family Medicine

## 2014-10-15 DIAGNOSIS — F909 Attention-deficit hyperactivity disorder, unspecified type: Secondary | ICD-10-CM

## 2014-10-15 MED ORDER — AMPHETAMINE-DEXTROAMPHETAMINE 30 MG PO TABS: 30.0000 mg | ORAL_TABLET | Freq: Two times a day (BID) | ORAL | Status: DC

## 2014-10-15 NOTE — Telephone Encounter (Signed)
Pt requesting refilll on her adderall 30 mg   Best phone for pt is [phone]

## 2014-10-15 NOTE — Telephone Encounter (Signed)
Please call the patient and let her know that her prescriptions are ready to be picked up at 104. I tried to call her twice, but the line was busy.

## 2014-10-16 NOTE — Telephone Encounter (Signed)
Left a detailed message advising pt her Rx is ready to pick up.

## 2015-01-23 ENCOUNTER — Telehealth: Payer: Self-pay

## 2015-01-23 NOTE — Telephone Encounter (Signed)
Left message advising pt to come in. 

## 2015-01-23 NOTE — Telephone Encounter (Signed)
Yes, as I documented in my OV note, she must be seen a minimum of every 6 months for refills of controlled ADHD medications.

## 2015-01-23 NOTE — Telephone Encounter (Signed)
Patient is unsure whether she needs to come in for an OV to get a refill on her Adderall   [phone]

## 2015-01-25 ENCOUNTER — Ambulatory Visit (INDEPENDENT_AMBULATORY_CARE_PROVIDER_SITE_OTHER): Payer: 59 | Admitting: Family Medicine

## 2015-01-25 VITALS — BP 118/80 | HR 69 | Temp 99.0°F | Resp 16 | Ht 62.25 in | Wt 122.5 lb

## 2015-01-25 DIAGNOSIS — F909 Attention-deficit hyperactivity disorder, unspecified type: Secondary | ICD-10-CM

## 2015-01-25 MED ORDER — AMPHETAMINE-DEXTROAMPHETAMINE 30 MG PO TABS: 30.0000 mg | ORAL_TABLET | Freq: Two times a day (BID) | ORAL | Status: DC

## 2015-01-25 NOTE — Patient Instructions (Signed)
Good to see you today Please try to stick with the same provider for your adderall- I am glad to take care of you, or I'm Debbie would see you as well Let's plan to meet back in 6 months; if you want to set up your "mychart" account we can email to plan a convenient time for you

## 2015-01-25 NOTE — Progress Notes (Signed)
[facility] [address] [place] [address] [phone]  Date:  01/25/2015   Name:  Karen Hensley   DOB:  1991/01/15   MRN:  [id number]  PCP:  No PCP Per Patient    Chief Complaint: Medication Refill   History of Present Illness:  Karen Hensley is a 24 y.o. very pleasant female patient who presents with the following:  Here today for refill of medications- she is treated for ADHD.  She received #3 rx on 3/7, to fill then, 4/7 and 5/7 She is generally doing well- no appetie changes She has been on this medication for some time She has been on this dose for about one year, on medication for 6 years or so She plans to take classes this summer, and she works full time.   Reviewed controlled substance database and she has no outside rx  Her PCP had been Eula Listen, but now that he is no longer here she is not sure who is her PCP and has been skipping around some  Wt Readings from Last 3 Encounters:  01/25/15 122 lb 8 oz (55.566 kg)  07/31/14 119 lb (53.978 kg)  01/19/14 116 lb (52.617 kg)     Patient Active Problem List   Diagnosis Date Noted  . ADHD (attention deficit hyperactivity disorder) 04/12/2012  . Dislocation of left shoulder joint 05/05/2011    Past Medical History  Diagnosis Date  . Anxiety     anxiety attacks  . 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 ADHD (attention deficit hyperactivity disorder)   . Allergy     Past Surgical History  Procedure Laterality Date  . External ear surgery  4-5 yrs. ago    cauliflower ear  . Shoulder arthroscopy  08/07/2011    Procedure: ARTHROSCOPY SHOULDER;  Surgeon: Eulas Post;  Location: [facility];  Service: Orthopedics;  Laterality: Left;  arthroscopy left shoulder with extensive debridement, capsulorrhaphy, Bankhardt repair  . Shoulder surgery  2012    left shoulder    History  Substance Use Topics  . Smoking status: Never Smoker    . Smokeless tobacco: Never Used  . Alcohol Use: 0.0 oz/week    0 Standard drinks or equivalent per week     Comment: occasionally    Family History  Problem Relation Age of Onset  . Heart attack Paternal Grandfather   . Anxiety disorder Paternal Grandfather   . Depression Paternal Grandfather   . Diabetes Neg Hx   . Hyperlipidemia Neg Hx   . Hypertension Neg Hx   . Sudden death Neg Hx   . ADD / ADHD Mother   . Depression Father   . ADD / ADHD Father   . ADD / ADHD Brother   . Asthma Brother   . Thyroid disease Maternal Grandmother   . Anxiety disorder Paternal Grandmother     Allergies  Allergen Reactions  . Sulfa Antibiotics Swelling    Swelling of face    Medication list has been reviewed and updated.  Current Outpatient Prescriptions on File Prior to Visit  Medication Sig Dispense Refill  . amphetamine-dextroamphetamine (ADDERALL) 30 MG tablet Take 1 tablet by mouth 2 (two) times daily. May fill 60 days after date on prescription. 60 tablet 0  . amphetamine-dextroamphetamine (ADDERALL) 30 MG tablet Take 1 tablet by mouth 2 (two) times daily. May fill 30 days after date on prescription. 60 tablet 0  . amphetamine-dextroamphetamine (ADDERALL) 30  MG tablet Take 1 tablet by mouth 2 (two) times daily. 60 tablet 0   No current facility-administered medications on file prior to visit.    Review of Systems:  As per HPI- otherwise negative.   Physical Examination: Filed Vitals:   01/25/15 1750  BP: 142/78  Pulse: 69  Temp: 99 F (37.2 C)  Resp: 16   Filed Vitals:   01/25/15 1750  Height: 5' 2.25" (1.581 m)  Weight: 122 lb 8 oz (55.566 kg)   Body mass index is 22.23 kg/(m^2). Ideal Body Weight: Weight in (lb) to have BMI = 25: 137.5  GEN: WDWN, NAD, Non-toxic, A & O x 3 HEENT: Atraumatic, Normocephalic. Neck supple. No masses, No LAD. Ears and Nose: No external deformity. CV: RRR, No M/G/R. No JVD. No thrill. No extra heart sounds. PULM: CTA B, no  wheezes, crackles, rhonchi. No retractions. No resp. distress. No accessory muscle use. ABD: S, NT, ND, +BS. No rebound. No HSM. EXTR: No c/c/e NEURO Normal gait.  PSYCH: Normally interactive. Conversant. Not depressed or anxious appearing.  Calm demeanor.    Assessment and Plan: Attention deficit hyperactivity disorder (ADHD), unspecified ADHD type - Plan: amphetamine-dextroamphetamine (ADDERALL) 30 MG tablet, amphetamine-dextroamphetamine (ADDERALL) 30 MG tablet, amphetamine-dextroamphetamine (ADDERALL) 30 MG tablet  Refilled her adderall.  It does seem that she has tried to make a good faith effort to stick to one provider, so I have her a 3 month rx.  She reports that she had an appt to see Eunice Blase but this was canceled so she came to the walk in.  Encouraged her to stick with me or Debbie to write her adderall   Signed Abbe Amsterdam, MD

## 2015-01-30 ENCOUNTER — Ambulatory Visit: Payer: 59 | Admitting: Family Medicine

## 2015-04-26 ENCOUNTER — Telehealth: Payer: Self-pay

## 2015-04-26 DIAGNOSIS — F909 Attention-deficit hyperactivity disorder, unspecified type: Secondary | ICD-10-CM

## 2015-04-26 MED ORDER — AMPHETAMINE-DEXTROAMPHETAMINE 30 MG PO TABS: 30.0000 mg | ORAL_TABLET | Freq: Two times a day (BID) | ORAL | Status: DC

## 2015-04-26 NOTE — Telephone Encounter (Signed)
Did refills for her, called to let her know she can pick them up Recheck in December

## 2015-04-26 NOTE — Telephone Encounter (Signed)
Pt is calling wanting a script for her amphetamine-dextroamphetamine (ADDERALL) 30 MG tablet [[id number]].Please advise at [phone]

## 2015-07-30 ENCOUNTER — Telehealth: Payer: Self-pay

## 2015-07-30 NOTE — Telephone Encounter (Signed)
Pt is needing a refill on adderall and would like to know if she needs to come in before she can get it

## 2015-07-30 NOTE — Telephone Encounter (Signed)
Pt advised to RTC. 

## 2015-07-30 NOTE — Telephone Encounter (Signed)
Last OV Pearline CablesJessica C Copland, MD at 01/25/2015 5:53 PM    Pearline CablesJessica C Copland, MD at 04/26/2015 5:07 PM     Status: Signed       Expand All Collapse All   Did refills for her, called to let her know she can pick them up Recheck in December

## 2015-08-14 ENCOUNTER — Encounter: Payer: Self-pay | Admitting: Family Medicine

## 2015-08-14 ENCOUNTER — Ambulatory Visit (INDEPENDENT_AMBULATORY_CARE_PROVIDER_SITE_OTHER): Payer: BLUE CROSS/BLUE SHIELD | Admitting: Family Medicine

## 2015-08-14 VITALS — BP 110/74 | HR 78 | Temp 98.2°F | Resp 16 | Ht 62.5 in | Wt 127.2 lb

## 2015-08-14 DIAGNOSIS — F909 Attention-deficit hyperactivity disorder, unspecified type: Secondary | ICD-10-CM | POA: Diagnosis not present

## 2015-08-14 DIAGNOSIS — Z23 Encounter for immunization: Secondary | ICD-10-CM | POA: Diagnosis not present

## 2015-08-14 MED ORDER — AMPHETAMINE-DEXTROAMPHETAMINE 30 MG PO TABS: 30.0000 mg | ORAL_TABLET | Freq: Two times a day (BID) | ORAL | Status: AC

## 2015-08-14 NOTE — Progress Notes (Signed)
[facility] [address] [place] [address] [phone]  Date:  08/14/2015   Name:  Karen Hensley   DOB:  May 21, 1991   MRN:  [id number]  PCP:  No PCP Per Patient    Chief Complaint: Follow-up; ADHD; and Medication Refill   History of Present Illness:  Karen Hensley is a 25 y.o. very pleasant female patient who presents with the following:  Here today for an [facility] medication refill.  She was seen here about 6 months ago for a recheck.   NCCSR does not show any unusual activity She is doing well currently. Feels that her current medication dose is ideal. She is able to sleep and eat ok.  Appetite has never had any problems.   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  She is enjoying this job Generally in good health Patient Active Problem List   Diagnosis Date Noted  . ADHD (attention deficit hyperactivity disorder) 04/12/2012  . Dislocation of left shoulder joint 05/05/2011    Past Medical History  Diagnosis Date  . Anxiety     anxiety attacks  . 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 ADHD (attention deficit hyperactivity disorder)   . Allergy     Past Surgical History  Procedure Laterality Date  . External ear surgery  4-5 yrs. ago    cauliflower ear  . Shoulder arthroscopy  08/07/2011    Procedure: ARTHROSCOPY SHOULDER;  Surgeon: Eulas Post;  Location: [facility];  Service: Orthopedics;  Laterality: Left;  arthroscopy left shoulder with extensive debridement, capsulorrhaphy, Bankhardt repair  . Shoulder surgery  2012    left shoulder    Social History  Substance Use Topics  . Smoking status: Never Smoker   . Smokeless tobacco: Never Used  . Alcohol Use: 0.0 oz/week    0 Standard drinks or equivalent per week     Comment: occasionally    Family History  Problem Relation Age of Onset  . Heart attack Paternal Grandfather   . Anxiety disorder Paternal  Grandfather   . Depression Paternal Grandfather   . Diabetes Neg Hx   . Hyperlipidemia Neg Hx   . Hypertension Neg Hx   . Sudden death Neg Hx   . ADD / ADHD Mother   . Depression Father   . ADD / ADHD Father   . ADD / ADHD Brother   . Asthma Brother   . Thyroid disease Maternal Grandmother   . Anxiety disorder Paternal Grandmother     Allergies  Allergen Reactions  . Sulfa Antibiotics Swelling    Swelling of face    Medication list has been reviewed and updated.  Current Outpatient Prescriptions on File Prior to Visit  Medication Sig Dispense Refill  . amphetamine-dextroamphetamine (ADDERALL) 30 MG tablet Take 1 tablet by mouth 2 (two) times daily. May fill 60 days after date on prescription. 60 tablet 0  . amphetamine-dextroamphetamine (ADDERALL) 30 MG tablet Take 1 tablet by mouth 2 (two) times daily. May fill 30 days after date on prescription. 60 tablet 0  . amphetamine-dextroamphetamine (ADDERALL) 30 MG tablet Take 1 tablet by mouth 2 (two) times daily. 60 tablet 0   No current facility-administered medications on file prior to visit.    Review of Systems:  As per HPI- otherwise negative.  Wt Readings from Last 3 Encounters:  08/14/15 127 lb 3.2 oz (57.698 kg)  01/25/15 122  lb 8 oz (55.566 kg)  07/31/14 119 lb (53.978 kg)    Physical Examination: Filed Vitals:   08/14/15 1059  BP: 110/74  Pulse: 78  Temp: 98.2 F (36.8 C)  Resp: 16   Filed Vitals:   08/14/15 1059  Height: 5' 2.5" (1.588 m)  Weight: 127 lb 3.2 oz (57.698 kg)   Body mass index is 22.88 kg/(m^2). Ideal Body Weight: Weight in (lb) to have BMI = 25: 138.6  GEN: WDWN, NAD, Non-toxic, A & O x 3 HEENT: Atraumatic, Normocephalic. Neck supple. No masses, No LAD. Ears and Nose: No external deformity. CV: RRR, No M/G/R. No JVD. No thrill. No extra heart sounds. PULM: CTA B, no wheezes, crackles, rhonchi. No retractions. No resp. distress. No accessory muscle use. ABD: S, NT, ND, +BS. No  rebound. No HSM. EXTR: No c/c/e NEURO Normal gait.  PSYCH: Normally interactive. Conversant. Not depressed or anxious appearing.  Calm demeanor.    Assessment and Plan: Attention deficit hyperactivity disorder (ADHD), unspecified ADHD type - Plan: amphetamine-dextroamphetamine (ADDERALL) 30 MG tablet, amphetamine-dextroamphetamine (ADDERALL) 30 MG tablet, amphetamine-dextroamphetamine (ADDERALL) 30 MG tablet  Immunization due - Plan: Flu Vaccine QUAD 36+ mos IM   Doing well with her current ADHD medication.  She drove from [place] to see me today.  Suggested that she might want to find an MD in her new hometown since I will be leaving [facility] and she will think about it.  Gave her a copy of her most recent NCCSR report to take with her   Meds ordered this encounter  Medications  . amphetamine-dextroamphetamine (ADDERALL) 30 MG tablet    Sig: Take 1 tablet by mouth 2 (two) times daily.    Dispense:  60 tablet    Refill:  0  . amphetamine-dextroamphetamine (ADDERALL) 30 MG tablet    Sig: Take 1 tablet by mouth 2 (two) times daily. May fill 30 days after date on prescription.    Dispense:  60 tablet    Refill:  0  . amphetamine-dextroamphetamine (ADDERALL) 30 MG tablet    Sig: Take 1 tablet by mouth 2 (two) times daily. May fill 60 days after date on prescription.    Dispense:  60 tablet    Refill:  0     Signed Abbe AmsterdamJessica Copland, MD

## 2015-08-14 NOTE — Patient Instructions (Signed)
It was good to see you today!  Continue using your adderall as directed and let us know if you want us to do RF for you in 3 months. However as you are now based in [place] you could consider finding a doctor there. Take care
